# Patient Record
Sex: Male | Born: 1935 | Race: White | Hispanic: No | Marital: Married | Smoking: Current every day smoker
Health system: Southern US, Community
[De-identification: ages and names within clinical notes are randomized; demographics above are authoritative.]

## PROBLEM LIST (undated history)

## (undated) DIAGNOSIS — T8859XA Other complications of anesthesia, initial encounter: Secondary | ICD-10-CM

## (undated) DIAGNOSIS — I10 Essential (primary) hypertension: Secondary | ICD-10-CM

## (undated) DIAGNOSIS — N400 Enlarged prostate without lower urinary tract symptoms: Secondary | ICD-10-CM

## (undated) DIAGNOSIS — K635 Polyp of colon: Secondary | ICD-10-CM

## (undated) DIAGNOSIS — L309 Dermatitis, unspecified: Secondary | ICD-10-CM

## (undated) DIAGNOSIS — R112 Nausea with vomiting, unspecified: Secondary | ICD-10-CM

## (undated) DIAGNOSIS — F329 Major depressive disorder, single episode, unspecified: Secondary | ICD-10-CM

## (undated) DIAGNOSIS — F32A Depression, unspecified: Secondary | ICD-10-CM

## (undated) DIAGNOSIS — I639 Cerebral infarction, unspecified: Secondary | ICD-10-CM

## (undated) DIAGNOSIS — K4090 Unilateral inguinal hernia, without obstruction or gangrene, not specified as recurrent: Secondary | ICD-10-CM

## (undated) DIAGNOSIS — Z9889 Other specified postprocedural states: Secondary | ICD-10-CM

## (undated) DIAGNOSIS — E785 Hyperlipidemia, unspecified: Secondary | ICD-10-CM

## (undated) DIAGNOSIS — D649 Anemia, unspecified: Secondary | ICD-10-CM

## (undated) DIAGNOSIS — T4145XA Adverse effect of unspecified anesthetic, initial encounter: Secondary | ICD-10-CM

## (undated) DIAGNOSIS — C3490 Malignant neoplasm of unspecified part of unspecified bronchus or lung: Secondary | ICD-10-CM

## (undated) DIAGNOSIS — N210 Calculus in bladder: Secondary | ICD-10-CM

## (undated) DIAGNOSIS — K219 Gastro-esophageal reflux disease without esophagitis: Secondary | ICD-10-CM

## (undated) DIAGNOSIS — R4701 Aphasia: Secondary | ICD-10-CM

## (undated) HISTORY — DX: Depression, unspecified: F32.A

## (undated) HISTORY — DX: Aphasia: R47.01

## (undated) HISTORY — DX: Benign prostatic hyperplasia without lower urinary tract symptoms: N40.0

## (undated) HISTORY — DX: Polyp of colon: K63.5

## (undated) HISTORY — DX: Hyperlipidemia, unspecified: E78.5

## (undated) HISTORY — DX: Essential (primary) hypertension: I10

## (undated) HISTORY — DX: Dermatitis, unspecified: L30.9

## (undated) HISTORY — DX: Gastro-esophageal reflux disease without esophagitis: K21.9

## (undated) HISTORY — DX: Cerebral infarction, unspecified: I63.9

## (undated) HISTORY — DX: Unilateral inguinal hernia, without obstruction or gangrene, not specified as recurrent: K40.90

## (undated) HISTORY — DX: Calculus in bladder: N21.0

## (undated) HISTORY — DX: Major depressive disorder, single episode, unspecified: F32.9

---

## 1998-03-23 ENCOUNTER — Ambulatory Visit (HOSPITAL_COMMUNITY): Admission: RE | Admit: 1998-03-23 | Discharge: 1998-03-23 | Payer: Self-pay | Admitting: Gastroenterology

## 1998-07-09 ENCOUNTER — Emergency Department (HOSPITAL_COMMUNITY): Admission: EM | Admit: 1998-07-09 | Discharge: 1998-07-09 | Payer: Self-pay | Admitting: Emergency Medicine

## 1998-07-09 ENCOUNTER — Encounter: Payer: Self-pay | Admitting: Emergency Medicine

## 1998-09-18 ENCOUNTER — Encounter: Payer: Self-pay | Admitting: *Deleted

## 1998-09-18 ENCOUNTER — Inpatient Hospital Stay (HOSPITAL_COMMUNITY): Admission: EM | Admit: 1998-09-18 | Discharge: 1998-09-22 | Payer: Self-pay | Admitting: *Deleted

## 1998-09-20 ENCOUNTER — Encounter: Payer: Self-pay | Admitting: *Deleted

## 2000-01-19 ENCOUNTER — Encounter: Payer: Self-pay | Admitting: Emergency Medicine

## 2000-01-19 ENCOUNTER — Encounter: Payer: Self-pay | Admitting: *Deleted

## 2000-01-19 ENCOUNTER — Inpatient Hospital Stay (HOSPITAL_COMMUNITY): Admission: EM | Admit: 2000-01-19 | Discharge: 2000-01-24 | Payer: Self-pay | Admitting: Emergency Medicine

## 2000-01-21 ENCOUNTER — Encounter: Payer: Self-pay | Admitting: Neurology

## 2000-01-22 ENCOUNTER — Encounter: Payer: Self-pay | Admitting: Neurology

## 2000-01-31 ENCOUNTER — Encounter: Admission: RE | Admit: 2000-01-31 | Discharge: 2000-03-15 | Payer: Self-pay | Admitting: Neurology

## 2001-07-24 ENCOUNTER — Ambulatory Visit (HOSPITAL_COMMUNITY): Admission: RE | Admit: 2001-07-24 | Discharge: 2001-07-24 | Payer: Self-pay | Admitting: Gastroenterology

## 2004-09-16 ENCOUNTER — Ambulatory Visit (HOSPITAL_COMMUNITY): Admission: RE | Admit: 2004-09-16 | Discharge: 2004-09-16 | Payer: Self-pay | Admitting: Gastroenterology

## 2004-09-25 ENCOUNTER — Inpatient Hospital Stay (HOSPITAL_COMMUNITY): Admission: EM | Admit: 2004-09-25 | Discharge: 2004-09-29 | Payer: Self-pay | Admitting: Emergency Medicine

## 2005-11-09 ENCOUNTER — Ambulatory Visit (HOSPITAL_COMMUNITY): Admission: RE | Admit: 2005-11-09 | Discharge: 2005-11-09 | Payer: Self-pay | Admitting: Family Medicine

## 2010-07-23 ENCOUNTER — Inpatient Hospital Stay (HOSPITAL_COMMUNITY)
Admission: EM | Admit: 2010-07-23 | Discharge: 2010-07-26 | DRG: 469 | Disposition: A | Discharge status: Skilled Nursing Facility | Payer: Medicare Other | Attending: Internal Medicine | Admitting: Internal Medicine

## 2010-07-23 ENCOUNTER — Emergency Department (HOSPITAL_COMMUNITY): Payer: Medicare Other

## 2010-07-23 DIAGNOSIS — D649 Anemia, unspecified: Secondary | ICD-10-CM | POA: Diagnosis not present

## 2010-07-23 DIAGNOSIS — S72009A Fracture of unspecified part of neck of unspecified femur, initial encounter for closed fracture: Principal | ICD-10-CM | POA: Diagnosis present

## 2010-07-23 DIAGNOSIS — I214 Non-ST elevation (NSTEMI) myocardial infarction: Secondary | ICD-10-CM | POA: Diagnosis present

## 2010-07-23 DIAGNOSIS — F3289 Other specified depressive episodes: Secondary | ICD-10-CM | POA: Diagnosis present

## 2010-07-23 DIAGNOSIS — Y92009 Unspecified place in unspecified non-institutional (private) residence as the place of occurrence of the external cause: Secondary | ICD-10-CM

## 2010-07-23 DIAGNOSIS — F17200 Nicotine dependence, unspecified, uncomplicated: Secondary | ICD-10-CM | POA: Diagnosis present

## 2010-07-23 DIAGNOSIS — Z7982 Long term (current) use of aspirin: Secondary | ICD-10-CM

## 2010-07-23 DIAGNOSIS — I739 Peripheral vascular disease, unspecified: Secondary | ICD-10-CM | POA: Diagnosis present

## 2010-07-23 DIAGNOSIS — IMO0002 Reserved for concepts with insufficient information to code with codable children: Secondary | ICD-10-CM | POA: Diagnosis present

## 2010-07-23 DIAGNOSIS — F329 Major depressive disorder, single episode, unspecified: Secondary | ICD-10-CM | POA: Diagnosis present

## 2010-07-23 DIAGNOSIS — Z23 Encounter for immunization: Secondary | ICD-10-CM

## 2010-07-23 DIAGNOSIS — Z01818 Encounter for other preprocedural examination: Secondary | ICD-10-CM

## 2010-07-23 DIAGNOSIS — K5900 Constipation, unspecified: Secondary | ICD-10-CM | POA: Diagnosis not present

## 2010-07-23 DIAGNOSIS — Y939 Activity, unspecified: Secondary | ICD-10-CM

## 2010-07-23 DIAGNOSIS — I2510 Atherosclerotic heart disease of native coronary artery without angina pectoris: Secondary | ICD-10-CM | POA: Diagnosis present

## 2010-07-23 DIAGNOSIS — Z8673 Personal history of transient ischemic attack (TIA), and cerebral infarction without residual deficits: Secondary | ICD-10-CM

## 2010-07-23 DIAGNOSIS — I10 Essential (primary) hypertension: Secondary | ICD-10-CM | POA: Diagnosis present

## 2010-07-23 DIAGNOSIS — W010XXA Fall on same level from slipping, tripping and stumbling without subsequent striking against object, initial encounter: Secondary | ICD-10-CM | POA: Diagnosis present

## 2010-07-23 DIAGNOSIS — K219 Gastro-esophageal reflux disease without esophagitis: Secondary | ICD-10-CM | POA: Diagnosis present

## 2010-07-23 DIAGNOSIS — E079 Disorder of thyroid, unspecified: Secondary | ICD-10-CM | POA: Diagnosis present

## 2010-07-23 DIAGNOSIS — Z01810 Encounter for preprocedural cardiovascular examination: Secondary | ICD-10-CM

## 2010-07-23 DIAGNOSIS — Y998 Other external cause status: Secondary | ICD-10-CM

## 2010-07-23 DIAGNOSIS — E119 Type 2 diabetes mellitus without complications: Secondary | ICD-10-CM | POA: Diagnosis present

## 2010-07-24 ENCOUNTER — Emergency Department (HOSPITAL_COMMUNITY): Payer: Medicare Other

## 2010-07-24 LAB — CBC
HCT: 47.1 % (ref 39.0–52.0)
Hemoglobin: 15.3 g/dL (ref 13.0–17.0)
MCHC: 32.5 g/dL (ref 30.0–36.0)
MCV: 94.6 fL (ref 78.0–100.0)
Platelets: 234 10*3/uL (ref 150–400)
RBC: 4.98 MIL/uL (ref 4.22–5.81)
WBC: 19.6 10*3/uL — ABNORMAL HIGH (ref 4.0–10.5)

## 2010-07-24 LAB — URINALYSIS, ROUTINE W REFLEX MICROSCOPIC
Bilirubin Urine: NEGATIVE
Glucose, UA: 100 mg/dL — AB
Glucose, UA: NEGATIVE mg/dL
Ketones, ur: 15 mg/dL — AB
Ketones, ur: 15 mg/dL — AB
Leukocytes, UA: NEGATIVE
Nitrite: NEGATIVE
Nitrite: NEGATIVE
Protein, ur: 30 mg/dL — AB
Protein, ur: 100 mg/dL — AB
Specific Gravity, Urine: 1.02 (ref 1.005–1.030)
Specific Gravity, Urine: 1.028 (ref 1.005–1.030)
Urobilinogen, UA: 0.2 mg/dL (ref 0.0–1.0)
Urobilinogen, UA: 0.2 mg/dL (ref 0.0–1.0)
pH: 5.5 (ref 5.0–8.0)
pH: 5.5 (ref 5.0–8.0)

## 2010-07-24 LAB — GLUCOSE, CAPILLARY
Glucose-Capillary: 151 mg/dL — ABNORMAL HIGH (ref 70–99)
Glucose-Capillary: 131 mg/dL — ABNORMAL HIGH (ref 70–99)

## 2010-07-24 LAB — CARDIAC PANEL(CRET KIN+CKTOT+MB+TROPI)
CK, MB: 10.6 ng/mL (ref 0.3–4.0)
CK, MB: 8.8 ng/mL (ref 0.3–4.0)
Relative Index: 0.7 (ref 0.0–2.5)
Relative Index: 0.8 (ref 0.0–2.5)
Total CK: 1585 U/L — ABNORMAL HIGH (ref 7–232)
Total CK: 1052 U/L — ABNORMAL HIGH (ref 7–232)
Troponin I: 0.11 ng/mL — ABNORMAL HIGH (ref 0.00–0.06)
Troponin I: 0.1 ng/mL — ABNORMAL HIGH (ref 0.00–0.06)

## 2010-07-24 LAB — BASIC METABOLIC PANEL
Calcium: 9.5 mg/dL (ref 8.4–10.5)
Creatinine, Ser: 1.41 mg/dL (ref 0.4–1.5)
GFR calc Af Amer: 59 mL/min — ABNORMAL LOW (ref >60)
Potassium: 4.2 mEq/L (ref 3.5–5.1)

## 2010-07-24 LAB — URINE MICROSCOPIC-ADD ON

## 2010-07-24 LAB — DIFFERENTIAL
Basophils Relative: 0 % (ref 0–1)
Eosinophils Absolute: 0 10*3/uL (ref 0.0–0.7)
Eosinophils Relative: 0 % (ref 0–5)
Lymphocytes Relative: 4 % — ABNORMAL LOW (ref 12–46)
Lymphs Abs: 0.7 10*3/uL (ref 0.7–4.0)

## 2010-07-24 LAB — MRSA PCR SCREENING: MRSA by PCR: NEGATIVE

## 2010-07-24 LAB — PROTIME-INR: INR: 0.99 (ref 0.00–1.49)

## 2010-07-24 LAB — CK TOTAL AND CKMB (NOT AT ARMC)
CK, MB: 12.9 ng/mL (ref 0.3–4.0)
Total CK: 1862 U/L — ABNORMAL HIGH (ref 7–232)

## 2010-07-25 LAB — DIFFERENTIAL
Basophils Absolute: 0 10*3/uL (ref 0.0–0.1)
Basophils Relative: 0 % (ref 0–1)
Eosinophils Absolute: 0.4 10*3/uL (ref 0.0–0.7)
Eosinophils Relative: 3 % (ref 0–5)
Lymphocytes Relative: 6 % — ABNORMAL LOW (ref 12–46)
Lymphs Abs: 0.8 10*3/uL (ref 0.7–4.0)
Monocytes Absolute: 0.9 10*3/uL (ref 0.1–1.0)
Monocytes Relative: 7 % (ref 3–12)
Neutro Abs: 11.1 10*3/uL — ABNORMAL HIGH (ref 1.7–7.7)
Neutrophils Relative %: 84 % — ABNORMAL HIGH (ref 43–77)

## 2010-07-25 LAB — HEMOGLOBIN A1C
Hgb A1c MFr Bld: 5.9 % — ABNORMAL HIGH (ref <5.7)
Mean Plasma Glucose: 123 mg/dL — ABNORMAL HIGH (ref <117)

## 2010-07-25 LAB — GLUCOSE, CAPILLARY: Glucose-Capillary: 130 mg/dL — ABNORMAL HIGH (ref 70–99)

## 2010-07-25 LAB — COMPREHENSIVE METABOLIC PANEL
ALT: 24 U/L (ref 0–53)
AST: 41 U/L — ABNORMAL HIGH (ref 0–37)
Calcium: 9.4 mg/dL (ref 8.4–10.5)
GFR calc Af Amer: >60 mL/min (ref >60)
Potassium: 4.6 mEq/L (ref 3.5–5.1)
Sodium: 138 mEq/L (ref 135–145)
Total Protein: 6.1 g/dL (ref 6.0–8.3)

## 2010-07-25 LAB — COMPREHENSIVE METABOLIC PANEL WITH GFR
Albumin: 3.4 g/dL — ABNORMAL LOW (ref 3.5–5.2)
Alkaline Phosphatase: 65 U/L (ref 39–117)
BUN: 26 mg/dL — ABNORMAL HIGH (ref 6–23)
CO2: 25 meq/L (ref 19–32)
Chloride: 108 meq/L (ref 96–112)
Creatinine, Ser: 1.25 mg/dL (ref 0.4–1.5)
GFR calc non Af Amer: 56 mL/min — ABNORMAL LOW (ref >60)
Glucose, Bld: 140 mg/dL — ABNORMAL HIGH (ref 70–99)
Total Bilirubin: 0.8 mg/dL (ref 0.3–1.2)

## 2010-07-25 LAB — CBC
HCT: 43.3 % (ref 39.0–52.0)
Hemoglobin: 14 g/dL (ref 13.0–17.0)
MCH: 30.7 pg (ref 26.0–34.0)
MCHC: 32.3 g/dL (ref 30.0–36.0)
MCV: 95 fL (ref 78.0–100.0)
Platelets: 213 K/uL (ref 150–400)
RBC: 4.56 MIL/uL (ref 4.22–5.81)
RDW: 15.3 % (ref 11.5–15.5)
WBC: 13.2 K/uL — ABNORMAL HIGH (ref 4.0–10.5)

## 2010-07-25 LAB — PROTIME-INR
INR: 1.06 (ref 0.00–1.49)
Prothrombin Time: 14 seconds (ref 11.6–15.2)

## 2010-07-25 LAB — PHOSPHORUS: Phosphorus: 2.3 mg/dL (ref 2.3–4.6)

## 2010-07-25 LAB — APTT: aPTT: 34 s (ref 24–37)

## 2010-07-25 LAB — SURGICAL PCR SCREEN
MRSA, PCR: NEGATIVE
Staphylococcus aureus: NEGATIVE

## 2010-07-25 LAB — MAGNESIUM: Magnesium: 2 mg/dL (ref 1.5–2.5)

## 2010-07-25 LAB — TSH: TSH: 0.459 u[IU]/mL (ref 0.350–4.500)

## 2010-07-26 ENCOUNTER — Inpatient Hospital Stay (HOSPITAL_COMMUNITY): Payer: Medicare Other

## 2010-07-26 ENCOUNTER — Inpatient Hospital Stay (HOSPITAL_COMMUNITY)
Admission: AD | Admit: 2010-07-26 | Discharge: 2010-07-29 | Disposition: A | Discharge status: Skilled Nursing Facility | Payer: Medicare Other | Source: Other Acute Inpatient Hospital | Attending: Specialist | Admitting: Specialist

## 2010-07-26 DIAGNOSIS — Z7982 Long term (current) use of aspirin: Secondary | ICD-10-CM

## 2010-07-26 DIAGNOSIS — Y998 Other external cause status: Secondary | ICD-10-CM

## 2010-07-26 DIAGNOSIS — Z01810 Encounter for preprocedural cardiovascular examination: Secondary | ICD-10-CM

## 2010-07-26 DIAGNOSIS — Z01818 Encounter for other preprocedural examination: Secondary | ICD-10-CM

## 2010-07-26 DIAGNOSIS — F3289 Other specified depressive episodes: Secondary | ICD-10-CM | POA: Diagnosis present

## 2010-07-26 DIAGNOSIS — D649 Anemia, unspecified: Secondary | ICD-10-CM | POA: Diagnosis not present

## 2010-07-26 DIAGNOSIS — I10 Essential (primary) hypertension: Secondary | ICD-10-CM | POA: Diagnosis present

## 2010-07-26 DIAGNOSIS — W010XXA Fall on same level from slipping, tripping and stumbling without subsequent striking against object, initial encounter: Secondary | ICD-10-CM | POA: Diagnosis present

## 2010-07-26 DIAGNOSIS — E079 Disorder of thyroid, unspecified: Secondary | ICD-10-CM | POA: Diagnosis present

## 2010-07-26 DIAGNOSIS — K219 Gastro-esophageal reflux disease without esophagitis: Secondary | ICD-10-CM | POA: Diagnosis present

## 2010-07-26 DIAGNOSIS — K5900 Constipation, unspecified: Secondary | ICD-10-CM | POA: Diagnosis not present

## 2010-07-26 DIAGNOSIS — Z8673 Personal history of transient ischemic attack (TIA), and cerebral infarction without residual deficits: Secondary | ICD-10-CM

## 2010-07-26 DIAGNOSIS — F329 Major depressive disorder, single episode, unspecified: Secondary | ICD-10-CM | POA: Diagnosis present

## 2010-07-26 DIAGNOSIS — Y939 Activity, unspecified: Secondary | ICD-10-CM

## 2010-07-26 DIAGNOSIS — E119 Type 2 diabetes mellitus without complications: Secondary | ICD-10-CM | POA: Diagnosis present

## 2010-07-26 DIAGNOSIS — Z23 Encounter for immunization: Secondary | ICD-10-CM

## 2010-07-26 DIAGNOSIS — I739 Peripheral vascular disease, unspecified: Secondary | ICD-10-CM | POA: Diagnosis present

## 2010-07-26 DIAGNOSIS — IMO0002 Reserved for concepts with insufficient information to code with codable children: Secondary | ICD-10-CM | POA: Diagnosis present

## 2010-07-26 DIAGNOSIS — F17200 Nicotine dependence, unspecified, uncomplicated: Secondary | ICD-10-CM | POA: Diagnosis present

## 2010-07-26 DIAGNOSIS — Y92009 Unspecified place in unspecified non-institutional (private) residence as the place of occurrence of the external cause: Secondary | ICD-10-CM

## 2010-07-26 DIAGNOSIS — S72009A Fracture of unspecified part of neck of unspecified femur, initial encounter for closed fracture: Secondary | ICD-10-CM | POA: Diagnosis present

## 2010-07-26 DIAGNOSIS — I2510 Atherosclerotic heart disease of native coronary artery without angina pectoris: Secondary | ICD-10-CM | POA: Diagnosis present

## 2010-07-26 DIAGNOSIS — I214 Non-ST elevation (NSTEMI) myocardial infarction: Secondary | ICD-10-CM | POA: Diagnosis present

## 2010-07-26 LAB — GLUCOSE, CAPILLARY
Glucose-Capillary: 119 mg/dL — ABNORMAL HIGH (ref 70–99)
Glucose-Capillary: 134 mg/dL — ABNORMAL HIGH (ref 70–99)
Glucose-Capillary: 109 mg/dL — ABNORMAL HIGH (ref 70–99)
Glucose-Capillary: 108 mg/dL — ABNORMAL HIGH (ref 70–99)

## 2010-07-26 LAB — URINE CULTURE
Colony Count: NO GROWTH
Culture  Setup Time: 201203250220
Culture: NO GROWTH

## 2010-07-26 LAB — ABO/RH: ABO/RH(D): A NEG

## 2010-07-26 NOTE — Consult Note (Signed)
[name], PROVENCIO NO.:  [id number]  MEDICAL RECORD NO.:  [id number]           PATIENT TYPE:  I  LOCATION:  4733                         FACILITY:  [facility]  PHYSICIAN:  Jake Bathe, MD      DATE OF BIRTH:  Nov 20, 1932  DATE OF CONSULTATION:  07/24/2010 DATE OF DISCHARGE:                                CONSULTATION   REQUESTING PHYSICIAN:  Baltazar Najjar, MD, [facility].  REASON FOR CONSULTATION:  Evaluation of preoperative cardiovascular risk in the setting of left hip fracture with mildly elevated troponin.  HISTORY OF PRESENT ILLNESS:  [name] is a 77 year old male with known peripheral vascular disease status post right-sided carotid endarterectomy and 100% occlusion of left internal carotid artery with longstanding ongoing tobacco use, multiple strokes in the past, diabetes, who tripped over a stool, fell, could not get up, and was in significant amount of discomfort in his hip.  History provided by his family states that he was down for a few hours before being able to be assisted to the emergency room for further evaluation.  A left-sided hip fracture was discovered and Dr. Shelle Iron of Orthopedics was consulted.  Cardiac biomarkers were cycled and his CK was originally [phone] with an MB of 12.9 and a troponin of 0.10.  The second set was lower at 1585, MB of 10.6, and a troponin of 0.11.  He denies any prior cardiovascular history although he has a history of stroke and peripheral vascular disease as described above.  He denies any chest pain, angina, shortness of breath, orthopnea, PND, syncope, dizziness, or bleeding issues.  He has not had any exertional angina. Overall, he has not described any cardiovascular symptoms.  PAST MEDICAL HISTORY: 1. Multiple strokes. 2. Carotid artery disease status post endarterectomy. 3. Diabetes, not on insulin.  SOCIAL HISTORY:  Still a smoker, been for several years, not interested in quitting.  No  alcohol.  No drug abuse.  His daughter is a Engineer, civil (consulting).  FAMILY HISTORY:  Currently noncontributory.  ALLERGIES:  No known drug allergies.  HOME MEDICATIONS: 1. Ramipril 5 mg a day. 2. Nifedipine XR 60 mg at bedtime. 3. Paxil 20 mg a day. 4. Senna. 5. Docusate. 6. Ranitidine 150 mg twice a day. 7. Aspirin 81 mg a day. 8. Simvastatin 20 mg a day. 9. Metformin 500 mg a day, currently on hold.  REVIEW OF SYSTEMS:  Unless specified above, all other 12 review of systems negative.  PHYSICAL EXAMINATION:  VITAL SIGNS:  Temperature 97.7, pulse currently approximately 95 but ranged as high as 116, temperatures 97.7, blood pressure 160/80, currently 94% on 2 L. GENERAL:  He is alert, in no acute distress, able to answer questions confidently but may be mildly confused at times. EYES:  Well-perfused conjunctivae.  EOMI.  No scleral icterus. NECK:  Supple.  Difficult to appreciate carotid bruits.  Prior endarterectomy scar noted. HEART:  Regular rate and rhythm with a 2/6 systolic murmur, right upper sternal border.  Normal PMI. LUNGS:  Diffuse mild wheezes heard throughout lung fields.  Normal respiratory effort. GU:  Deferred. RECTAL:  Deferred. ABDOMEN:  Soft, nontender, and normoactive  bowel sounds.  No rebound or guarding. SKIN:  Multiple bruises and ecchymoses noted.  DATA:  Lab work as above.  Creatinine 1.4 and glucose 178.  EKG personally viewed shows sinus tachycardia, rate 103 with no ST-segment changes.  No Q-waves noted.  CT of head showed chronic ischemic changes and left thyroid hypodensity.  Thyroid ultrasound was recommended.  CT of spine showed no acute abnormality.  Chest x-ray showed no active cardiopulmonary disease.  ASSESSMENT AND PLAN:  This is a 78 year old male with right hip fracture, longstanding tobacco use, prior strokes, carotid endarterectomy of right carotid internal carotid artery, awaiting hip arthroplasty. 1. Preoperative risk assessment - he has  mildly elevated troponin of     0.11/0.10 in the setting of highly elevated CK and MB.  It is most     likely a type 2 non-ST-elevation myocardial infarction, not true     acute coronary syndrome in the traditional sense of thrombus     formation within the coronary arteries.  Nonetheless, a positive     troponin does portend a worse cardiovascular prognosis overall.  I do believe that his CK and MB are most likely elevated secondary to his fall or muscular injury.  They are trending downward.  I do not think that he warrants IV anticoagulation at this time.  I had a long discussion with his daughter who is a nurse about risks of surgery.  Of course, with his prior strokes, peripheral vascular disease, and now with type 2 non-ST-elevation myocardial infarction with a mildly positive troponin, he is at increased risk for surgery, however, I would advocate moving forward with surgery because of the increased mortality and not performing hip arthroplasty for him over the next 6-12 months.  Currently, he is not having any chest discomfort, no shortness of breath, no signs of overt heart failure, and no signs of any dangerous arrhythmias.  Once again, this mildly positive troponin is likely secondary to his underlying medical condition/musculoskeletal injury.  I will order a 2-D echocardiogram to assess his left ventricular function and I will also place him on low-dose metoprolol 12.5 mg twice a day.  Once again, I would proceed with surgery.  His family and he understand the increased cardiovascular risk given his multiple comorbidities and with his mildly elevated troponin. 1. Tobacco use - counseled on tobacco cessation.  He is not interested     in quitting. 2. Diabetes, per primary team currently monitoring. 3. Hypertension - currently well controlled.  Watch blood pressure. 4. Thyroid hypodensity - per primary team seen on CT scan. 5. Carotid artery disease - currently stable.   Aspirin.  I do not feel     that he needs Plavix at this point due to increased bleeding risk     surrounding arthroplasty.  Please call with any further questions.     Jake Bathe, MD     MCS/MEDQ  D:  07/24/2010  T:  07/25/2010  Job:  [id number]  cc:   Baltazar Najjar, MD Holley Bouche, M.D.  Electronically Signed by Donato Schultz MD on 07/26/2010 02:38:44 PM

## 2010-07-27 LAB — CBC
Hemoglobin: 10.9 g/dL — ABNORMAL LOW (ref 13.0–17.0)
MCH: 30 pg (ref 26.0–34.0)
MCHC: 32 g/dL (ref 30.0–36.0)
Platelets: 183 10*3/uL (ref 150–400)
RBC: 3.63 MIL/uL — ABNORMAL LOW (ref 4.22–5.81)

## 2010-07-27 LAB — BASIC METABOLIC PANEL
CO2: 23 mEq/L (ref 19–32)
Calcium: 8.3 mg/dL — ABNORMAL LOW (ref 8.4–10.5)
Chloride: 108 mEq/L (ref 96–112)
Creatinine, Ser: 1.12 mg/dL (ref 0.4–1.5)
GFR calc Af Amer: >60 mL/min (ref >60)
Sodium: 135 mEq/L (ref 135–145)

## 2010-07-27 LAB — GLUCOSE, CAPILLARY: Glucose-Capillary: 137 mg/dL — ABNORMAL HIGH (ref 70–99)

## 2010-07-27 NOTE — Consult Note (Signed)
[name], BRAZZEL            ACCOUNT NO.:  [id number]  MEDICAL RECORD NO.:  [id number]           PATIENT TYPE:  I  LOCATION:  1621                         FACILITY:  [facility]  PHYSICIAN:  Thad Ranger, MD       DATE OF BIRTH:  [birth year]  DATE OF CONSULTATION:  07/27/2010 DATE OF DISCHARGE:                                CONSULTATION   REQUESTING PHYSICIAN:  Jene Every, M.D.  REASON FOR CONSULTATION:  Medical management.  HISTORY OF PRESENT ILLNESS:  Mr. Rivero is a 77 year old male with history of diabetes, multiple strokes, tobacco abuse, depression, GERD, hypertension, currently under orthopedic service after left hemiarthroplasty on July 26, 2010.  The patient was initially admitted after a mechanical fall and had left hip pain.  He was found to have left femoral fracture.  He underwent left hip hemiarthroplasty on July 26, 2010, with spinal anesthesia.  Postoperatively, the patient has done well.  Medicine consultation is obtained for the medical management.  At this time of my encounter, the patient has no complaints at all.  He denies any chest pain or any shortness of breath, nausea, vomiting or abdominal pain.  He does have chronic constipation.  Otherwise doing well.  The patient had physical therapy evaluation today as well.  PAST MEDICAL HISTORY: 1. History of multiple strokes in the past with slight right-sided     fine motor deficit. 2. History of diabetes, not on insulin. 3. History of carotid artery disease, on aspirin. 4. History of right carotid endarterectomy. 5. Depression. 6. GERD. 7. Tobacco abuse.  ALLERGIES:  No known allergies.  FAMILY HISTORY:  Noncontributory.  SOCIAL HISTORY:  The patient smokes 2 packs per day for 55 years.  Does not drink or abuse any drugs.  Prior to this admission, he was living at home, independent with his ADLs.  PHYSICAL EXAMINATION:  VITAL SIGNS:  Blood pressure 119/70, temperature 97.4, pulse 107,  respirations 20, O2 sats 96% on 2 L. GENERAL:  The patient is alert and awake, however, poor historian. Family is at bedside. HEENT:  Anicteric sclerae, pink conjunctivae.  Pupils reactive to light and accommodation.  EOMI. NECK:  Supple.  No lymphadenopathy, no JVD. CVS:  S1, S2 clear.  Tachycardia, however, no murmurs appreciated. LUNGS:  Fairly clear to auscultation bilaterally. ABDOMEN:  Soft, nontender, nondistended. EXTREMITIES:  No cyanosis, clubbing or edema noted.  Bilateral lower SCDs are noted.  DIAGNOSTIC DATA:  BMET today, July 27, 2010; sodium 135, potassium 3.7, BUN 27, creatinine 1.1, calcium 8.3.  CBC; WBC 8.9, hemoglobin 10.9, hematocrit 34.1, platelets 183.  ASSESSMENT AND PLAN:  Mr. Fontenot is a 77 year old male with left hip fracture status post left hip hemiarthroplasty with multiple other medical problems, fairly stable. 1. Left hip fracture status post left hip hemiarthroplasty, post day     #1.  We will defer the management to the primary service     orthopedics. 2. History of diabetes.  Continue sliding scale insulin for now. 3. Hypertension.  Currently BP is well controlled, however, the     patient noted to be on multiple antihypertensives as an outpatient,  on ramipril, clonidine patch and nifedipine.  I will for now     continue only ramipril and hold off on clonidine and nifedipine to     avoid hypotension. 4. History of depression.  Continue paroxetine. 5. History of multiple cerebrovascular accidents in the past with     history of carotid artery disease.  Restart aspirin. 6. Gastroesophageal reflux disease.  Continue ranitidine.  Currently     stable. 7. Thyroid hypodensity 6 mm on the CT scan.  Obtain TSH, free T4 and     T3 levels.  Thyroid ultrasound can be done inpatient versus     outpatient.  8. Tobacco abuse.  We will place the patient on nicotine patch.     Currently he is not wheezing or having any respiratory distress.     We  will also place him on albuterol nebs for any shortness of     breath or wheezing. 9. Constipation.  Place him on Senokot and MiraLax. 10.DVT prophylaxis.  Noted the patient to be on bilateral SCDs as well     as Lovenox.  This patient will be followed from [facility].  Thank you for the consult.  It was pleasure taking care of Mr. Aziah Brostrom.     Thad Ranger, MD     RR/MEDQ  D:  07/27/2010  T:  07/27/2010  Job:  [id number]  cc:   Jene Every, M.D. Fax: [phone]  Melida Quitter, M.D. Fax: [phone]  Electronically Signed by Hodaya Curto  on 07/27/2010 03:52:35 PM

## 2010-07-28 ENCOUNTER — Inpatient Hospital Stay (HOSPITAL_COMMUNITY): Payer: Medicare Other

## 2010-07-28 LAB — GLUCOSE, CAPILLARY: Glucose-Capillary: 137 mg/dL — ABNORMAL HIGH (ref 70–99)

## 2010-07-28 LAB — CBC
HCT: 32.5 % — ABNORMAL LOW (ref 39.0–52.0)
Hemoglobin: 10.2 g/dL — ABNORMAL LOW (ref 13.0–17.0)
MCH: 29.6 pg (ref 26.0–34.0)
MCHC: 31.4 g/dL (ref 30.0–36.0)
RDW: 14.8 % (ref 11.5–15.5)

## 2010-07-28 LAB — BASIC METABOLIC PANEL
BUN: 27 mg/dL — ABNORMAL HIGH (ref 6–23)
CO2: 26 mEq/L (ref 19–32)
Calcium: 8.3 mg/dL — ABNORMAL LOW (ref 8.4–10.5)
Creatinine, Ser: 1.13 mg/dL (ref 0.4–1.5)
GFR calc non Af Amer: >60 mL/min (ref >60)
Glucose, Bld: 134 mg/dL — ABNORMAL HIGH (ref 70–99)

## 2010-07-28 LAB — TSH: TSH: 0.533 u[IU]/mL (ref 0.350–4.500)

## 2010-07-28 LAB — T3, FREE: T3, Free: 2 pg/mL — ABNORMAL LOW (ref 2.3–4.2)

## 2010-07-28 LAB — T4, FREE: Free T4: 0.95 ng/dL (ref 0.80–1.80)

## 2010-07-29 LAB — CROSSMATCH

## 2010-07-29 LAB — BASIC METABOLIC PANEL
Chloride: 108 mEq/L (ref 96–112)
GFR calc non Af Amer: >60 mL/min (ref >60)
Glucose, Bld: 118 mg/dL — ABNORMAL HIGH (ref 70–99)
Potassium: 3.9 mEq/L (ref 3.5–5.1)
Sodium: 139 mEq/L (ref 135–145)

## 2010-07-29 LAB — CBC
HCT: 34.6 % — ABNORMAL LOW (ref 39.0–52.0)
RBC: 3.67 MIL/uL — ABNORMAL LOW (ref 4.22–5.81)
RDW: 14.7 % (ref 11.5–15.5)
WBC: 8 10*3/uL (ref 4.0–10.5)

## 2010-07-29 LAB — GLUCOSE, CAPILLARY: Glucose-Capillary: 131 mg/dL — ABNORMAL HIGH (ref 70–99)

## 2010-07-29 NOTE — H&P (Signed)
[name], [name]            ACCOUNT NO.:  [id number]  MEDICAL RECORD NO.:  [id number]           PATIENT TYPE:  E  LOCATION:  [facility]                         FACILITY:  [facility]  PHYSICIAN:  Michiel Cowboy, MDDATE OF BIRTH:  1932-06-06  DATE OF ADMISSION:  07/24/2010 DATE OF DISCHARGE:                             HISTORY & PHYSICAL   ATTENDING PHYSICIAN:  Michiel Cowboy, MD  PRIMARY CARE PROVIDER:  Unknown.  The patient is not sure.  CHIEF COMPLAINT:  Hip pain.  HISTORY OF PRESENT ILLNESS:  The patient is a 77 year old gentleman.  He lives alone by himself.  He has history of diabetes and stroke.  He states he has tripped over the stool and fell.  He could not get up, he did not endorse hitting his head to me.  At this point, his only pain complaint is in the left hip.  He denied any history of chest pain, shortness of breath, but I wonder if his memory is intact.  We attempted to get one of his family but they were not picking up the phone.  He is a poor historian and cannot provide any much more history other than that.  REVIEW OF SYSTEMS:  Otherwise unable to obtain except for as above.  PAST MEDICAL HISTORY:  Significant for; 1. Multiple strokes. 2. Diabetes, not on insulin. 3. History of carotid artery disease. 4. Status post right carotid endarterectomy.  SOCIAL HISTORY:  The patient continues to smoke.  Do not drink or abuse drugs.  FAMILY HISTORY:  Noncontributory.  ALLERGIES:  None.  MEDICATIONS:  He cannot recollect what medicines he takes, he thinks he might be on the blood thinner.  In 2006, he was on Plavix for his stroke.  I do not have any list of medications for him.  He cannot recollect at all what he takes.  His family is unreachable at this point.  PHYSICAL EXAMINATION:  VITAL SIGNS:  Temperature 98.9, blood pressure 110/65, pulse 109, respirations 18, satting 94% on 2 L and 86% on room air. GENERAL:  The patient appears to be in no  acute distress. HEENT:  Head is nontraumatic.  Somewhat dryish mucous membranes. LUNGS:  Occasional wheezes. HEART:  Rapid but regular.  No murmurs appreciated. ABDOMEN:  Soft, nontender, nondistended. NEUROLOGIC:  Strength on upper extremity is intact.  He is moving bilateral lower extremity but left lower extremity is shortened and rotated secondary to fracture. CRANIAL NERVES:  Appears to be intact.  LABORATORY DATA:  White blood cell count 19.6, hemoglobin 15.3, sodium 137, potassium 4.2, creatinine 1.4 which is up from baseline of 1.2, repeated creatinine since then.  Chest x-ray is unremarkable.  Left hip showing acute left femoral fracture.  CT scan of the head and neck is done and pending.  EKG is unchanged from prior sinus tachycardia, no ischemic changes.  ASSESSMENT AND PLAN:  This is a 77 year old gentleman with left hip fracture, history of diabetes, strokes, and possible dementia. 1. Left hip fracture.  We will admit to Dr. Shelle Iron, the orthopedics to     consult for treatment.  For preoperative workup, we will cycle  cardiac enzymes as he is a very unreliable historian and Anesthesia     should be aware of his history of coronary artery disease.  We will     hold off on beta blockers as his blood pressure is somewhat soft.     At this point, his blood pressure allows would give preoperative     beta blockers if possible because he is already somewhat     tachycardic.  I am not sure if he still on Plavix or not, would     wait until morning to get hold his family.  If he has been on     Plavix, he might need to have some time before his procedure could     be done. 2. History of diabetes.  We will put on sliding scale for now, hold     metformin if he has been taking it. 3. Elevated white blood cell count.  Chest x-ray is unremarkable.  We     will check urine, this could be stress demargination. 4. History of tobacco abuse.  We will give Xopenex as needed and      schedule Atrovent. 5. Trouble with memory I wonder if the patient has some degree of     dementia we will need to get hold of family to confirm this.  We     will write for social work consult to assess his safety at home.     Once the patient able to ambulate,  we will write for PT/OT consult     as well. 6. Prophylaxis, Protonix and SCDs.     Michiel Cowboy, MD     AVD/MEDQ  D:  07/24/2010  T:  07/24/2010  Job:  [id number]  cc:   Jene Every, M.D.  Electronically Signed by Therisa Doyne MD on 07/29/2010 02:39:48 AM

## 2010-07-29 NOTE — Consult Note (Signed)
  NAMEGERMANY, CHELF NO.:  [id number]  MEDICAL RECORD NO.:  [id number]           PATIENT TYPE:  LOCATION:                                 FACILITY:  PHYSICIAN:  Jene Every, M.D.    DATE OF BIRTH:  1933-04-09  DATE OF CONSULTATION:  07/24/2010 DATE OF DISCHARGE:                                CONSULTATION   CHIEF COMPLAINT:  Left hip pain.  HISTORY:  This is a pleasant 77 year old male who fell onto his left hip.  He was seen in the emergency room, diagnosed with a femoral neck fracture.  He will be admitted by the Medicine Service, was consulted for a femoral neck fracture.  The patient had no loss of consciousness.  Apparently, he had a CT scan of his head and neck which were negative.  REVIEW OF SYSTEMS:  Negative for chest pain, shortness of breath.  PAST MEDICAL HISTORY:  Diabetes, stroke.  Current medications are unknown.  He is on a beta-blocker, BP medications, possible Plavix.  Tobacco positive.  Alcohol negative.  SOCIAL HISTORY:  He lives at home.  PHYSICAL EXAMINATION:  VITAL SIGNS:  The patient afebrile, temperature 98.9, pulse 101, respiration 24. HEENT:  Within normal limits. CORONARY:  Regular rate rhythm. PULMONARY:  Clear to auscultation. ABDOMEN:  Soft, nontender. EXTREMITIES:  Left lower extremity externally rotated and slightly short.  He has good dorsiflexion and plantar flexion.  Dorsalis pedis 2+ and posterior tibial pulse.  Pain with attempted range of motion of his left hip.  Compartments are soft.  Nontender lumbar spine.  X-rays of the chest are negative.  X-rays of the hip demonstrated displaced femoral neck fracture.  CT scan of head and neck is negative.  LABORATORY DATA:  White count is 19.6.  Creatinine is 1.4, INR 0.99.  IMPRESSION: 1. Acute displaced left femoral neck fracture. 2. Comorbidities including stroke, noninsulin-dependant diabetes,     possibly on Plavix. 3. Leukocytosis, urinalysis  pending. 4. Tobacco use.  PLAN:  Discussed options.  We need to pursue with left hip hemiarthroplasty following medical clearance and pending further workup. Risks and benefits discussed including bleeding, infection, dislocation, need for revision, DVT, PE, anesthetic complications, etc.  He will admitted on the Medical Service to undergo IV hydration.  Then, we will proceed accordingly, DVT prophylaxis, etc.     Jene Every, M.D.     JB/MEDQ  D:  07/24/2010  T:  07/24/2010  Job:  [id number]  Electronically Signed by Jene Every M.D. on 07/29/2010 81:19:14 AM

## 2010-07-29 NOTE — Op Note (Addendum)
[name], [name] NO.:  [id number]  MEDICAL RECORD NO.:  [id number]           PATIENT TYPE:  I  LOCATION:  1621                         FACILITY:  [facility]  PHYSICIAN:  [name], M.D.    DATE OF BIRTH:  1932-07-21  DATE OF PROCEDURE:  07/26/2010 DATE OF DISCHARGE:                              OPERATIVE REPORT   PREOPERATIVE DIAGNOSIS:  Left femoral neck fracture.  POSTOPERATIVE DIAGNOSIS:  Left femoral neck fracture.  PROCEDURE PERFORMED:  Left hip hemiarthroplasty.  ANESTHESIA:  Spinal.  ASSISTANT:  OR tech.  COMPONENTS:  DePuy Prodigy 16.5 stem, 55 bipolar head.  BLOOD LOSS:  600 cc.  BRIEF HISTORY:  This is a 77 year old who sustained a femoral neck fracture, indicated for replacement.  Risks and benefits discussed including bleeding, infection, damage to neurovascular structures, suboptimal range of motion, DVT, PE, anesthetic complication, dislocation.  TECHNIQUE:  With the patient in supine position after induction of adequate anesthesia, 1 g Kefzol and 600 clindamycin, the patient had multiple skin lesions from his fall.  In addition, the patient had following his spinal, had evacuated his bowel contents, it was felt that additional clindamycin was then warranted.  In the right lateral decubitus position, all bony prominences well padded, hip holder was utilized.  Foley to gravity, well leg padded.  Left peritrochanteric and hip and thigh were prepped and draped in usual sterile fashion.  Skin incision based upon the trochanter was made through the skin. Subcutaneous tissue was dissected.  Electrocautery was utilized to achieve hemostasis.  Fascia lata identified, divided along the skin incision.  Self-retaining retractor was placed.  Adductor tenotomy performed.  Hip internally rotated.  External rotators were identified. Most were torn, identified, tagged, and reflected posteriorly to protect sciatic nerve throughout the case.   Hematoma was evacuated.  Fracture was noted.  Osteotomy performed 1 fingerbreadth above the lesser trochanter, head removed.  This was sized to 55.  We then used the initiator, found the canal, sequentially reamed to 16 mm in diameter with good cortical purchase, sequentially broached.  We felt the 16.5 large was better than the small.  This was then selected.  After copious irrigation of the canal and palpating inside of the canal, there was no breach of the cortex.  We selected the 16.5 Prodigy.  Head was removed, measured at 55.  Acetabulum inspected, no severe osteoarthrosis, was debrided and irrigated.  Electrocautery was utilized to achieve strict hemostasis.  I then impacted the Prodigy in appropriate version with excellent fit and purchase.  Following this, we cleaned the trunnion, put a bipolar assembly 1.5 head, 55 bipolar assembly impacted onto the trunnion, reduced the hip, and it was stable throughout a full range of motion.  Wound copiously irrigated.  Electrocautery was utilized to achieve strict hemostasis.  Copiously irrigated again throughout. Repaired the adductor tenotomy with 1 Vicryl interrupted figure-of-8 sutures.  The part of the capsule, which was intact after the capsulotomy was performed, was repaired with #1 Vicryl interrupted figure-of-8 sutures.  Fascia lata repaired with #1 Vicryl interrupted figure-of-8 sutures.  There was no significant hematoma at the closure time.  Irrigated the  subcutaneous tissue and they were closed with 0 and 2-0 Vicryl simple sutures.  Skin was reapproximated with staples.  Wound was dressed sterilely, secured with Elastoplast dressing.  Placed supine on the hospital bed.  Leg lengths equivalent.  Pulses intact.  Knee immobilizer placed and transported to the recovery room in satisfactory condition.  The patient tolerated procedure well.  No complications.  Blood loss 600 cc.     [name], M.D.     [name]  D:   07/26/2010  T:  07/27/2010  Job:  [id number]  Electronically Signed by [name] M.D. on 08/18/2010 12:04:30 PM

## 2010-08-01 HISTORY — PX: HIP ARTHROPLASTY: SHX981

## 2010-09-06 NOTE — Discharge Summary (Signed)
NAMEMARLAN, STEWARD            ACCOUNT NO.:  [id number]  MEDICAL RECORD NO.:  [id number]           PATIENT TYPE:  I  LOCATION:  1621                         FACILITY:  [facility]  PHYSICIAN:  Jene Every, M.D.    DATE OF BIRTH:  09/04/1932  DATE OF ADMISSION:  07/24/2010 DATE OF DISCHARGE:  07/29/2010                              DISCHARGE SUMMARY   ADMISSION DIAGNOSES: 1. Status post fall with the left femoral neck fracture. 2. History of multiple cerebrovascular accidents. 3. Diabetes. 4. Coronary artery disease. 5. Status post right endocardectomy.  DISCHARGE DIAGNOSES: 1. Status post fall with  the left femoral neck fracture. 2. History of multiple cerebrovascular accidents. 3. Diabetes. 4. Coronary artery disease. 5. Status post right endocardectomy. 6. Status post left hip hemiarthroplasty. 7. Asymptomatic postoperative anemia. Otherwise same as above.  HISTORY:  [name] is a 77 year old gentleman with multiple medical history. He unfortunately sustained a fall and noted immediate onset of left hip pain.  He was initially admitted over [facility] by [facility] hospitalist. They obtained medical as well as cardiac clearance.  When the patient was cleared, he was transferred over to [facility] to undergo left hip hemiarthroplasty.  Dr. Shelle Iron evaluated the patient at [facility] as well as in the ER. Risks, benefits of the surgery were discussed with the patient and family, they do elect to proceed.  The patient was again transferred to [facility] secondary to unavailability of the OR at the [facility]. Dr. Shelle Iron did assume care of the patient once at [facility]. Medicine did consult along with [place].  CONSULTS:  PT/OT Care Management.  CARDIOLOGY:  [facility].  PROCEDURE:  The patient was taken to the OR, underwent left hip hemiarthroplasty.  SURGEON:  Jene Every, M.D.  ASSISTANT:  Roma Schanz PA-C.  ANESTHESIA:   General.  COMPLICATIONS:  None.  LABORATORY DATA:  Preadmission labs shows white cell count 19.6, hemoglobin 15.3, hematocrit 47.1.  This was monitored throughout the hospital course.  White cell count did normalize at time of discharge, 8.0; hemoglobin remained fairly stable, postoperative hemoglobin 10.9; hematocrit 34.6.  Routine coagulation studies done preop showed PT 13.3, INR of 0.99.  The patient was placed on Lovenox for DVT prophylaxis. There no daily PT/INR was obtained. Glucose is monitored throughout the hospital stay, remained fairly stable. At time of discharge, capillary glucose is 131.  Preop chemistries showed sodium 137, potassium 4.2 with a normal BUN and creatinine.  He did have decreased GFR. Throughout the hospital stay, chemistries were continued to be evaluated, sodium and potassium remained stable.  The patient did have elevated BUN of 27; on the of 28, this normalized to 21 with a creatinine of 0.91. Preop urinalysis showed moderate blood, 100 mg/dL of glucose, 30 mg/dL of protein and green-yellow casts were noted, which is present. Repeat urinalysis obtained which continued to show blood in the urine.  Preop MRSA screen of staph screen was negative.  IMAGING:  Preoperative chest x-ray showed no active cardiopulmonary disease.  The patient did have multiple other radiologic studies, CT of the spine, CT of the head which showed  chronic changes, no acute changes were noted of the as C-spine or of the head.  Postoperative films of the hip showed stable position with hemiarthroplasty without complication.  Preoperative EKG showed sinus tachycardia, but was cleared by cardiology to undergo surgery.  HOSPITAL COURSE:  The patient was initially seen in [facility] Emergency Room and evaluated by Dr. Shelle Iron, admitted by the medical staff. Cardiac as well as medical clearance was obtained. Once clearance was obtained, the patient was transferred over the [facility] secondary to no OR being available at [facility]. He was taken to the OR and underwent the above-stated procedure without difficulty.  He was then transferred to Dr. Shelle Iron service, transferred to the orthopedic floor. Postoperatively, the patient did fairly well.  He did note pain in the hip as expected.  Vital signs remained stable.  He was afebrile. Hemoglobin was stable at 10.9, hematocrit 31.4. He had good urine output.  We did reconsult medicine to follow along with [place] while he was at [facility]. The patient did progress slowly with physical therapy. Discharge planning was initiated for placement in the SNF. Over the course of next few days, the patient continued to stabilize. Hemoglobin remained stable.  Vital signs remained stable as well.  Postop day #3, as of this time, the patient was stable to be discharged to nursing home of choice.  Vital signs stable, afebrile.  Hemoglobin 10.99, hematocrit 34.6.  Incision was clean, dry, intact.  No evidence of hematoma.  Motor neurovascular function intact to the lower extremity.  The patient was on Lovenox for DVT prophylaxis.  DISPOSITION:  Patient stable to be discharged to [facility].  FOLLOWUP:  He is to follow with Dr. Shelle Iron in approximately 14 days for staple removal and x-ray.  WOUND CARE:  Change his dressing daily and keep this area clean and dry.  ACTIVITIES:  Be partial weightbearing 25-50% on the left lower extremity.  Continue with elevation.  DISCHARGE MEDICATIONS:  Discharge medications as per med rec sheet, including Lovenox as per in the hospital for DVT prophylaxis and to be on this for a total of 14 days.  DIET:  Carb-modified.  CONDITION ON DISCHARGE:  Stable.  FINAL DIAGNOSIS:  Status post left hip hemiarthroplasty.     Roma Schanz, P.A.   ______________________________ Jene Every, M.D.    CS/MEDQ  D:  07/29/2010  T:  07/29/2010  Job:  [id number]  Electronically Signed by Roma Schanz P.A. on 08/30/2010 12:02:59 PM Electronically Signed by Jene Every M.D. on 09/06/2010 02:23:20 PM

## 2010-09-10 ENCOUNTER — Emergency Department (INDEPENDENT_AMBULATORY_CARE_PROVIDER_SITE_OTHER): Payer: Medicare Other

## 2010-09-10 ENCOUNTER — Emergency Department (HOSPITAL_BASED_OUTPATIENT_CLINIC_OR_DEPARTMENT_OTHER)
Admission: EM | Admit: 2010-09-10 | Discharge: 2010-09-11 | Disposition: A | Discharge status: Other Acute Inpatient Hospital | Payer: Medicare Other | Source: Home / Self Care | Attending: Emergency Medicine | Admitting: Emergency Medicine

## 2010-09-10 DIAGNOSIS — M7989 Other specified soft tissue disorders: Secondary | ICD-10-CM

## 2010-09-10 DIAGNOSIS — R5381 Other malaise: Secondary | ICD-10-CM | POA: Insufficient documentation

## 2010-09-10 DIAGNOSIS — E875 Hyperkalemia: Secondary | ICD-10-CM | POA: Insufficient documentation

## 2010-09-10 DIAGNOSIS — R4182 Altered mental status, unspecified: Secondary | ICD-10-CM

## 2010-09-10 DIAGNOSIS — N19 Unspecified kidney failure: Secondary | ICD-10-CM | POA: Insufficient documentation

## 2010-09-10 DIAGNOSIS — J189 Pneumonia, unspecified organism: Secondary | ICD-10-CM | POA: Insufficient documentation

## 2010-09-10 DIAGNOSIS — E1169 Type 2 diabetes mellitus with other specified complication: Secondary | ICD-10-CM | POA: Insufficient documentation

## 2010-09-10 DIAGNOSIS — Z8679 Personal history of other diseases of the circulatory system: Secondary | ICD-10-CM | POA: Insufficient documentation

## 2010-09-10 DIAGNOSIS — R5383 Other fatigue: Secondary | ICD-10-CM | POA: Insufficient documentation

## 2010-09-10 DIAGNOSIS — F0390 Unspecified dementia without behavioral disturbance: Secondary | ICD-10-CM

## 2010-09-10 DIAGNOSIS — Z79899 Other long term (current) drug therapy: Secondary | ICD-10-CM | POA: Insufficient documentation

## 2010-09-10 DIAGNOSIS — R627 Adult failure to thrive: Secondary | ICD-10-CM | POA: Insufficient documentation

## 2010-09-10 DIAGNOSIS — N390 Urinary tract infection, site not specified: Secondary | ICD-10-CM | POA: Insufficient documentation

## 2010-09-10 LAB — CK TOTAL AND CKMB (NOT AT ARMC)
CK, MB: 4.4 ng/mL — ABNORMAL HIGH (ref 0.3–4.0)
Relative Index: INVALID (ref 0.0–2.5)
Total CK: 72 U/L (ref 7–232)

## 2010-09-10 LAB — COMPREHENSIVE METABOLIC PANEL
AST: 59 U/L — ABNORMAL HIGH (ref 0–37)
Albumin: 2.8 g/dL — ABNORMAL LOW (ref 3.5–5.2)
BUN: 99 mg/dL — ABNORMAL HIGH (ref 6–23)
Calcium: 9.4 mg/dL (ref 8.4–10.5)
Chloride: 101 mEq/L (ref 96–112)
Creatinine, Ser: 8.5 mg/dL — ABNORMAL HIGH (ref 0.4–1.5)
GFR calc Af Amer: 7 mL/min — ABNORMAL LOW (ref >60)
GFR calc non Af Amer: 6 mL/min — ABNORMAL LOW (ref >60)
Total Bilirubin: 0.2 mg/dL — ABNORMAL LOW (ref 0.3–1.2)

## 2010-09-10 LAB — DIFFERENTIAL
Basophils Absolute: 0 10*3/uL (ref 0.0–0.1)
Basophils Relative: 0 % (ref 0–1)
Eosinophils Absolute: 0 10*3/uL (ref 0.0–0.7)
Eosinophils Relative: 0 % (ref 0–5)
Lymphocytes Relative: 4 % — ABNORMAL LOW (ref 12–46)
Lymphs Abs: 0.6 K/uL — ABNORMAL LOW (ref 0.7–4.0)
Monocytes Absolute: 0.9 10*3/uL (ref 0.1–1.0)
Monocytes Relative: 6 % (ref 3–12)
Neutro Abs: 14.1 10*3/uL — ABNORMAL HIGH (ref 1.7–7.7)
Neutrophils Relative %: 91 % — ABNORMAL HIGH (ref 43–77)

## 2010-09-10 LAB — PROTIME-INR
INR: 1.12 (ref 0.00–1.49)
Prothrombin Time: 14.6 seconds (ref 11.6–15.2)

## 2010-09-10 LAB — URINALYSIS, ROUTINE W REFLEX MICROSCOPIC
Bilirubin Urine: NEGATIVE
Glucose, UA: NEGATIVE mg/dL
Hgb urine dipstick: NEGATIVE
Ketones, ur: NEGATIVE mg/dL
Nitrite: NEGATIVE
Protein, ur: NEGATIVE mg/dL
Specific Gravity, Urine: 1.014 (ref 1.005–1.030)
Urobilinogen, UA: 0.2 mg/dL (ref 0.0–1.0)
pH: 5 (ref 5.0–8.0)

## 2010-09-10 LAB — COMPREHENSIVE METABOLIC PANEL WITH GFR
ALT: 70 U/L — ABNORMAL HIGH (ref 0–53)
Alkaline Phosphatase: 121 U/L — ABNORMAL HIGH (ref 39–117)
CO2: 14 meq/L — ABNORMAL LOW (ref 19–32)
Glucose, Bld: 70 mg/dL (ref 70–99)
Potassium: 6 meq/L — ABNORMAL HIGH (ref 3.5–5.1)
Sodium: 135 meq/L (ref 135–145)
Total Protein: 6.9 g/dL (ref 6.0–8.3)

## 2010-09-10 LAB — GLUCOSE, CAPILLARY
Glucose-Capillary: 54 mg/dL — ABNORMAL LOW (ref 70–99)
Glucose-Capillary: 105 mg/dL — ABNORMAL HIGH (ref 70–99)

## 2010-09-10 LAB — URINE MICROSCOPIC-ADD ON

## 2010-09-10 LAB — APTT: aPTT: 32 s (ref 24–37)

## 2010-09-10 LAB — CBC
HCT: 31.5 % — ABNORMAL LOW (ref 39.0–52.0)
Hemoglobin: 10.1 g/dL — ABNORMAL LOW (ref 13.0–17.0)
MCH: 29.4 pg (ref 26.0–34.0)
MCHC: 32.1 g/dL (ref 30.0–36.0)
MCV: 91.8 fL (ref 78.0–100.0)
Platelets: 511 10*3/uL — ABNORMAL HIGH (ref 150–400)
RBC: 3.43 MIL/uL — ABNORMAL LOW (ref 4.22–5.81)
RDW: 15.8 % — ABNORMAL HIGH (ref 11.5–15.5)
WBC: 15.6 K/uL — ABNORMAL HIGH (ref 4.0–10.5)

## 2010-09-10 LAB — TROPONIN I: Troponin I: <0.3 ng/mL (ref <0.30)

## 2010-09-11 ENCOUNTER — Inpatient Hospital Stay (HOSPITAL_COMMUNITY)
Admission: EM | Admit: 2010-09-11 | Discharge: 2010-09-13 | DRG: 682 | Disposition: A | Discharge status: Skilled Nursing Facility | Payer: Medicare Other | Source: Other Acute Inpatient Hospital | Attending: Internal Medicine | Admitting: Internal Medicine

## 2010-09-11 ENCOUNTER — Inpatient Hospital Stay (HOSPITAL_COMMUNITY): Payer: Medicare Other

## 2010-09-11 DIAGNOSIS — G9341 Metabolic encephalopathy: Secondary | ICD-10-CM | POA: Diagnosis present

## 2010-09-11 DIAGNOSIS — D649 Anemia, unspecified: Secondary | ICD-10-CM | POA: Diagnosis present

## 2010-09-11 DIAGNOSIS — N210 Calculus in bladder: Secondary | ICD-10-CM | POA: Diagnosis present

## 2010-09-11 DIAGNOSIS — Z7901 Long term (current) use of anticoagulants: Secondary | ICD-10-CM

## 2010-09-11 DIAGNOSIS — N179 Acute kidney failure, unspecified: Principal | ICD-10-CM | POA: Diagnosis present

## 2010-09-11 DIAGNOSIS — J189 Pneumonia, unspecified organism: Secondary | ICD-10-CM | POA: Diagnosis present

## 2010-09-11 DIAGNOSIS — Z6825 Body mass index (BMI) 25.0-25.9, adult: Secondary | ICD-10-CM

## 2010-09-11 DIAGNOSIS — I10 Essential (primary) hypertension: Secondary | ICD-10-CM | POA: Diagnosis present

## 2010-09-11 DIAGNOSIS — N1330 Unspecified hydronephrosis: Secondary | ICD-10-CM | POA: Diagnosis present

## 2010-09-11 DIAGNOSIS — J9819 Other pulmonary collapse: Secondary | ICD-10-CM | POA: Diagnosis present

## 2010-09-11 DIAGNOSIS — I2510 Atherosclerotic heart disease of native coronary artery without angina pectoris: Secondary | ICD-10-CM | POA: Diagnosis present

## 2010-09-11 DIAGNOSIS — N390 Urinary tract infection, site not specified: Secondary | ICD-10-CM | POA: Diagnosis present

## 2010-09-11 DIAGNOSIS — E1169 Type 2 diabetes mellitus with other specified complication: Secondary | ICD-10-CM | POA: Diagnosis present

## 2010-09-11 DIAGNOSIS — E875 Hyperkalemia: Secondary | ICD-10-CM | POA: Diagnosis present

## 2010-09-11 DIAGNOSIS — J690 Pneumonitis due to inhalation of food and vomit: Secondary | ICD-10-CM | POA: Diagnosis present

## 2010-09-11 DIAGNOSIS — Z8673 Personal history of transient ischemic attack (TIA), and cerebral infarction without residual deficits: Secondary | ICD-10-CM

## 2010-09-11 DIAGNOSIS — R630 Anorexia: Secondary | ICD-10-CM | POA: Diagnosis present

## 2010-09-11 LAB — GLUCOSE, CAPILLARY
Glucose-Capillary: 136 mg/dL — ABNORMAL HIGH (ref 70–99)
Glucose-Capillary: 158 mg/dL — ABNORMAL HIGH (ref 70–99)
Glucose-Capillary: 207 mg/dL — ABNORMAL HIGH (ref 70–99)
Glucose-Capillary: 40 mg/dL — CL (ref 70–99)
Glucose-Capillary: 83 mg/dL (ref 70–99)
Glucose-Capillary: 90 mg/dL (ref 70–99)
Glucose-Capillary: 93 mg/dL (ref 70–99)

## 2010-09-11 LAB — BASIC METABOLIC PANEL
Calcium: 9.4 mg/dL (ref 8.4–10.5)
Chloride: 111 mEq/L (ref 96–112)
Creatinine, Ser: 5.2 mg/dL — ABNORMAL HIGH (ref 0.4–1.5)
Creatinine, Ser: 3.66 mg/dL — ABNORMAL HIGH (ref 0.4–1.5)
GFR calc Af Amer: 13 mL/min — ABNORMAL LOW (ref >60)
GFR calc Af Amer: 20 mL/min — ABNORMAL LOW (ref >60)
Sodium: 141 mEq/L (ref 135–145)
Sodium: 142 mEq/L (ref 135–145)

## 2010-09-11 LAB — CBC
MCH: 29 pg (ref 26.0–34.0)
Platelets: 482 10*3/uL — ABNORMAL HIGH (ref 150–400)
RBC: 3.31 MIL/uL — ABNORMAL LOW (ref 4.22–5.81)
WBC: 10.2 10*3/uL (ref 4.0–10.5)

## 2010-09-11 LAB — LACTIC ACID, PLASMA: Lactic Acid, Venous: 1.2 mmol/L (ref 0.5–2.2)

## 2010-09-11 LAB — PROCALCITONIN: Procalcitonin: 1.66 ng/mL

## 2010-09-11 NOTE — H&P (Signed)
NAMEJAMIRE, SHABAZZ NO.:  [id number]  MEDICAL RECORD NO.:  [id number]           PATIENT TYPE:  I  LOCATION:  2604                         FACILITY:  [facility]  PHYSICIAN:  Della Goo, M.D. DATE OF BIRTH:  1933/04/26  DATE OF ADMISSION:  09/11/2010 DATE OF DISCHARGE:                             HISTORY & PHYSICAL   PRIMARY CARE PHYSICIAN:  [facility], Dr. Murray Hodgkins.  CHIEF COMPLAINT:  Decreased urine output and abdominal distention.  HISTORY OF PRESENT ILLNESS:  This is a 77 year old male who was sent from an area rehab center to the [facility] Emergency Department secondary to decreased urine output since the a.m. along with urinary retention.  The patient also had increased somnolence and decreased responsiveness over the day.  The patient's son gives the history and the history has also been obtained through the medical records.  The patient is unable to give any history at this time.  The patient's son reports that the patient has a bladder stone which had been obstructing his urine.  In the emergency department, a Foley catheter was placed which relieved more than 2 liters of urine.  His emergency department evaluation also revealed a pneumonia of the right middle lobe.  Laboratory studies also revealed hyperkalemia with a potassium level of 6.0 and an elevated BUN and creatinine at 99/8.50. The patient was started on therapies to cover the pneumonia with IV vancomycin and Zosyn and IV fluids were also ordered and therapy for the hyperkalemia was initiated.  The patient was referred for medical admission and transferred to the [facility] campus.  The patient has also had complaints of poor intake of foods and liquids.  PAST MEDICAL HISTORY:  Significant for: 1. Previous CVAs x3 of the left brain in 1997, 2000, and 2001 and in     2006 as well. 2. Hypertension. 3. Type 2 diabetes mellitus. 4. Coronary artery disease. 5.  Status post right carotid endarterectomy. 6. Status post open reduction and internal fixation of left femoral     neck fracture status post fall on July 26, 2010, by orthopedic     surgeon, Dr. Jene Every.  MEDICATIONS:  Will need to be further verified.  ALLERGIES:  No known drug allergies.  SOCIAL HISTORY:  The patient is currently residing at a rehab center following recovery from his hip surgery.  He has a previous history of smoking.  He is a nondrinker.  No history of illicit drug usage.  FAMILY HISTORY:  Positive for coronary artery disease in his mother. Father had congestive heart failure syndrome.  REVIEW OF SYSTEMS:  Unable to obtain from the patient.  PHYSICAL EXAMINATION FINDINGS:  GENERAL:  This is a pleasantly confused 77 year old well-nourished, well-developed elderly Caucasian male who is in no acute distress currently. VITAL SIGNS:  Temperature 97.6, blood pressure 134/66, heart rate 86, respirations 19, and O2 saturation 98%. HEENT:  Normocephalic and atraumatic.  Pupils are equally round and reactive to light.  Extraocular movements are intact.  Funduscopic benign.  There is no scleral icterus.  Nares are patent bilaterally. Oropharynx is clear. NECK:  Supple.  Full  range of motion.  No thyromegaly, adenopathy, or jugular venous distention. CARDIOVASCULAR:  Regular rate and rhythm.  No murmurs, gallops, or rubs appreciated.  Normal S1 and S2. LUNGS:  Breathing is unlabored and the chest wall excursion is symmetric.  The lung fields are clear to auscultation bilaterally. There are no rales, rhonchi, or wheezes. ABDOMEN:  Positive bowel sounds, soft, nontender, nondistended.  No hepatosplenomegaly. EXTREMITIES:  Without cyanosis, clubbing, or edema. NEUROLOGIC:  Generalized weakness and there are no acute deficits.  LABORATORY STUDIES:  White blood cell count 15.6, hemoglobin 10.1, hematocrit 31.5, platelets 511, neutrophils 91%, lymphocytes 4%,  MCV 91.8.  Sodium 135, potassium 6.0, chloride 101, CO2 of 14, BUN 99, creatinine 8.50, and glucose 70.  Protime 14.6, INR 1.12, and PTT 32. Urinalysis positive for small leukocyte esterase.  Urine micro reveals white blood cells 11-20.  CK total 72, CK-MB 4.4, and troponin less than 0.30.  Chest x-ray reveals right middle lobe infiltrate.  Ultrasound of the left lower extremity is negative for deep venous thrombosis.  ASSESSMENT:  A 77 year old male being admitted with: 1. Pneumonia. 2. Hyperkalemia. 3. Acute renal failure. 4. Metabolic encephalopathy. 5. Urinary retention secondary to bladder stone. 6. Hypoglycemia. 7. Anemia. 8. Anorexia.  PLAN:  Admit to the Step-Down ICU area.  Blood cultures x2 have been ordered and antibiotic therapy of IV vancomycin and Zosyn have been ordered.  Nebulizer treatments and supplemental oxygen have also been ordered and gentle IV fluids as well.  The patient will be started on Kayexalate therapy secondary to the elevated potassium level.  A repeat check of the potassium level will be performed in 6 hours.  Orders have been written to call the rounding physician with these results for further orders if needed.  A Foley catheter has been placed to relieve the patient's urinary obstruction and a renal ultrasound study has been ordered, and the patient will be placed on the hypoglycemic protocol and the patient will also be monitored for hyperglycemia as well.  The patient is a full code for now.     Della Goo, M.D.     HJ/MEDQ  D:  09/11/2010  T:  09/11/2010  Job:  [id number]  Electronically Signed by Della Goo M.D. on 09/11/2010 07:51:53 PM

## 2010-09-12 LAB — CBC
Hemoglobin: 9.6 g/dL — ABNORMAL LOW (ref 13.0–17.0)
MCHC: 31.1 g/dL (ref 30.0–36.0)
RDW: 16 % — ABNORMAL HIGH (ref 11.5–15.5)

## 2010-09-12 LAB — BASIC METABOLIC PANEL
CO2: 22 mEq/L (ref 19–32)
Calcium: 9.1 mg/dL (ref 8.4–10.5)
GFR calc Af Amer: 37 mL/min — ABNORMAL LOW (ref >60)
GFR calc non Af Amer: 30 mL/min — ABNORMAL LOW (ref >60)
Sodium: 142 mEq/L (ref 135–145)

## 2010-09-12 LAB — GLUCOSE, CAPILLARY
Glucose-Capillary: 176 mg/dL — ABNORMAL HIGH (ref 70–99)
Glucose-Capillary: 136 mg/dL — ABNORMAL HIGH (ref 70–99)

## 2010-09-13 LAB — CBC
HCT: 30.1 % — ABNORMAL LOW (ref 39.0–52.0)
MCHC: 30.2 g/dL (ref 30.0–36.0)
MCV: 95.3 fL (ref 78.0–100.0)
RDW: 16 % — ABNORMAL HIGH (ref 11.5–15.5)

## 2010-09-13 LAB — BASIC METABOLIC PANEL
BUN: 19 mg/dL (ref 6–23)
Calcium: 9.3 mg/dL (ref 8.4–10.5)
GFR calc non Af Amer: 55 mL/min — ABNORMAL LOW (ref >60)
Glucose, Bld: 117 mg/dL — ABNORMAL HIGH (ref 70–99)

## 2010-09-13 LAB — GLUCOSE, CAPILLARY: Glucose-Capillary: 136 mg/dL — ABNORMAL HIGH (ref 70–99)

## 2010-09-14 LAB — GLUCOSE, CAPILLARY: Glucose-Capillary: 93 mg/dL (ref 70–99)

## 2010-09-17 LAB — CULTURE, BLOOD (ROUTINE X 2): Culture: NO GROWTH

## 2010-09-22 NOTE — Discharge Summary (Signed)
NAMEJOSHUA, [name]            ACCOUNT NO.:  [id number]  MEDICAL RECORD NO.:  [id number]           PATIENT TYPE:  I  LOCATION:  6736                         FACILITY:  [facility]  PHYSICIAN:  Conley Canal, MD      DATE OF BIRTH:  07-19-1932  DATE OF ADMISSION:  09/11/2010 DATE OF DISCHARGE:                              DISCHARGE SUMMARY   PRIMARY CARE PHYSICIAN:  Lenon Curt. Chilton Si, MD  UROLOGIST:  Lindaann Slough, MD  ADMIT DIAGNOSES: 1. Acute renal failure secondary to urinary retention. 2. Obstructive uropathy secondary to bladder stone. 3. Decreased level of consciousness secondary to metabolic     encephalopathy. 4. Pneumonia, possible aspiration versus healthcare associated. 5. Hyperkalemia. 6. Hypoglycemia. 7. Anemia. 8. Anorexia.  The patient also has a history of indirect inguinal     hernia, cerebrovascular accident x3, hypertension, coronary artery     disease and type 2 diabetes. 9. Status post open reduction internal fixation for hip fracture on     July 26, 2010.  CONDITION ON DISCHARGE:  The patient is alert and oriented.  He is not eating particularly well, but he is weightbearing.  His acute renal failure has now resolved and he is ready for discharge to skilled nursing facility [facility]  HISTORY AND BRIEF HOSPITAL COURSE:  [name] is a very pleasant 77- year-old male who was sent from [facility] to [facility] Emergency Department secondary to decreased urine output in the morning of May 12.  The patient had had increased somnolence and decreased responsiveness over the previous day.  The patient's son reported that he had a bladder stone which has been obstructing.  In the emergency department on arrival, the Foley catheter was placed which relieved more than 2 L of urine.  Emergency Department evaluation also revealed pneumonia in the right middle lobe and hyperkalemia with a potassium of 6 and an elevated BUN of 99,  creatinine 8.5.  The patient was admitted.  He was started on broad-spectrum antibiotics including vancomycin and Zosyn as well as IV fluids.  Blood cultures were drawn prior to antibiotics being given.  Foley catheter was placed and left in throughout the admission secondary to urinary retention.  Just prior to discharge, and a voiding test was done.  Foley catheter was removed. The patient was found to have urinary retention once again. Consequently, his Foley catheter will be left in place until he sees Dr. Brunilda Payor on Wednesday May 16.  Over the course of his brief hospital stay, his mental status cleared, although he does have chronic dysarthria secondary to history of CVA, also his groin and penile pain resolved, and with the Foley in place, his BUN and creatinine on May 14 are now 19 and 1.28 respectively.  PHYSICAL EXAMINATION:  GENERAL:  At the time of discharge, the patient is sleepy but awakens easily. VITAL SIGNS:  Temperature 98.8, pulse 101, respirations 20, blood pressure 146/53, oxygen saturation is 95% on room air. HEAD:  Atraumatic normocephalic. EYES:  Anicteric with pupils that are equal, round. NOSE:  No nasal discharge or exterior lesions. MOUTH:  He has red Jell-O that  has drooled down the outside of his mouth.  He has poor dentition and moist mucous membranes.  NECK:  Supple with midline trachea.  No JVD.  No lymphadenopathy. CHEST:  Demonstrates no accessory muscle use.  No wheezes or crackles to my auscultation. HEART:  Tachycardic without murmur, rub, or gallop. ABDOMEN:  Soft, nontender, nondistended with bowel sounds. EXTREMITIES:  He has left lower extremity 1 to 2+ pitting edema.  No right lower extremity edema. NEUROLOGIC:  He is alert and oriented.  He has some trouble being understood because of speech and residual speech difficulty from previous strokes.  Otherwise, he has no focal neuro deficits. PSYCHIATRIC:  He appears in no apparent distress.  He is  pleasant, cooperative.  Grooming is moderate.  Foley catheter remains in place. For seizures, the patient had a Foley catheter placed on admission, otherwise there were no procedures.  IMAGING:  He had a renal ultrasound done on Sep 11, 2010, that showed mild right-sided hydronephrosis.  There may be cyst in the upper pole of the right kidney.  Follow-up ultrasound could be used to ensure resolution.  There is some echogenic material in the decompressed bladder.  This may be related to infectious debris or hemorrhage.  The patient had Doppler ultrasounds of his left lower extremity.  Because of edema, there was no evidence for DVT of his left lower extremity.  He had a chest x-ray on May 11 that showed patchy airspace disease in the right medial base compatible with atelectasis or infiltrate.  PERTINENT LABS:  On admission, the patient's white count was 15.6, today has normalized to 7.8.  His hemoglobin on admission was 10.1, hematocrit 31.5, platelets 511.  Sodium 136, potassium 6.0, chloride 101, bicarb 14, BUN 99, creatinine 8.5, glucose was 70, total bilirubin 0.2, alkaline phosphatase 121, AST 59, ALT 70, serum albumin 2.8, CK-MB was elevated at 4.4.  Urinary analysis showed wbc's 11-20 with small leukocytes.  Positive for mild infection.  Blood cultures were drawn. The preliminary results showed no growth.  DISCHARGE MEDICATIONS: 1. Tamsulosin 0.4 mg 1 tablet daily at bedtime. 2. Ambien 5 mg 1 tablet daily at bedtime for insomnia if needed. 3. Aspirin 81 mg 1 tablet daily. 4. Dilaudid 2 mg 1 tablet by mouth every 4 hours as needed for severe     pain. 5. Lumigan 1 drop in both eyes daily at bedtime. 6. MiraLax 17 g once by mouth daily. 7. Nifedipine XR 60 mg 1 tablet by mouth daily at bedtime. 8. Paxil 20 mg 1 tablet by mouth daily at bedtime. 9. Ranitidine 150 mg 1 tablet twice daily. 10.Rocephin 1 g per day for 10 days, injected intramuscularly,     starting on Sep 10, 2010, and we will finish on Sep 20, 2010. 11.Senokot 2 tablets by mouth daily at bedtime. 12.Simvastatin 40 mg 1 tablet by mouth daily at bedtime.  DISCHARGE INSTRUCTIONS:  The patient is going to a skilled nursing facility.  He does have a followup appointment with Dr. Jamison Neighbor on May 16 at 10 a.m., telephone number [phone].  The patient is to increase his activity slowly.  He will have physical therapy at [facility].  He is to return to a carb-modified diet and primary follow up with Dr. Murray Hodgkins as needed.     Stephani Police, PA   ______________________________ Conley Canal, MD    MLY/MEDQ  D:  09/13/2010  T:  09/13/2010  Job:  [id number]  cc:   Merton Border  G. Chilton Si, M.D. Lindaann Slough, M.D.  Electronically Signed by Algis Downs PA on 09/20/2010 04:18:57 PM Electronically Signed by Conley Canal  on 09/22/2010 05:41:41 PM

## 2010-10-01 DIAGNOSIS — N210 Calculus in bladder: Secondary | ICD-10-CM

## 2010-10-01 HISTORY — PX: TRANSURETHRAL RESECTION OF PROSTATE: SHX73

## 2010-10-01 HISTORY — DX: Calculus in bladder: N21.0

## 2010-10-12 ENCOUNTER — Other Ambulatory Visit: Payer: Self-pay | Admitting: Urology

## 2010-10-12 ENCOUNTER — Ambulatory Visit (HOSPITAL_COMMUNITY)
Admission: RE | Admit: 2010-10-12 | Discharge: 2010-10-13 | Disposition: A | Discharge status: Nursing Home (Includes ICF) | Payer: Medicare Other | Source: Ambulatory Visit | Attending: Urology | Admitting: Urology

## 2010-10-12 ENCOUNTER — Ambulatory Visit (HOSPITAL_COMMUNITY): Payer: Medicare Other

## 2010-10-12 DIAGNOSIS — Z8673 Personal history of transient ischemic attack (TIA), and cerebral infarction without residual deficits: Secondary | ICD-10-CM | POA: Insufficient documentation

## 2010-10-12 DIAGNOSIS — N401 Enlarged prostate with lower urinary tract symptoms: Secondary | ICD-10-CM | POA: Insufficient documentation

## 2010-10-12 DIAGNOSIS — E119 Type 2 diabetes mellitus without complications: Secondary | ICD-10-CM | POA: Insufficient documentation

## 2010-10-12 DIAGNOSIS — Z7982 Long term (current) use of aspirin: Secondary | ICD-10-CM | POA: Insufficient documentation

## 2010-10-12 DIAGNOSIS — N210 Calculus in bladder: Secondary | ICD-10-CM | POA: Insufficient documentation

## 2010-10-12 DIAGNOSIS — J4489 Other specified chronic obstructive pulmonary disease: Secondary | ICD-10-CM | POA: Insufficient documentation

## 2010-10-12 DIAGNOSIS — R29898 Other symptoms and signs involving the musculoskeletal system: Secondary | ICD-10-CM | POA: Insufficient documentation

## 2010-10-12 DIAGNOSIS — J449 Chronic obstructive pulmonary disease, unspecified: Secondary | ICD-10-CM | POA: Insufficient documentation

## 2010-10-12 DIAGNOSIS — N138 Other obstructive and reflux uropathy: Secondary | ICD-10-CM | POA: Insufficient documentation

## 2010-10-12 DIAGNOSIS — I10 Essential (primary) hypertension: Secondary | ICD-10-CM | POA: Insufficient documentation

## 2010-10-12 DIAGNOSIS — Z79899 Other long term (current) drug therapy: Secondary | ICD-10-CM | POA: Insufficient documentation

## 2010-10-12 DIAGNOSIS — F0390 Unspecified dementia without behavioral disturbance: Secondary | ICD-10-CM | POA: Insufficient documentation

## 2010-10-12 LAB — GLUCOSE, CAPILLARY

## 2010-10-12 LAB — BASIC METABOLIC PANEL
CO2: 26 mEq/L (ref 19–32)
Chloride: 105 mEq/L (ref 96–112)
Creatinine, Ser: 1.05 mg/dL (ref 0.4–1.5)
GFR calc Af Amer: >60 mL/min (ref >60)
GFR calc non Af Amer: >60 mL/min (ref >60)
Potassium: 4.4 mEq/L (ref 3.5–5.1)

## 2010-10-12 LAB — CBC
MCV: 95.5 fL (ref 78.0–100.0)
Platelets: 252 10*3/uL (ref 150–400)
RBC: 3.98 MIL/uL — ABNORMAL LOW (ref 4.22–5.81)
WBC: 6.1 10*3/uL (ref 4.0–10.5)

## 2010-10-13 LAB — BASIC METABOLIC PANEL
CO2: 25 mEq/L (ref 19–32)
Calcium: 9.3 mg/dL (ref 8.4–10.5)
Chloride: 105 mEq/L (ref 96–112)
Glucose, Bld: 104 mg/dL — ABNORMAL HIGH (ref 70–99)
Sodium: 138 mEq/L (ref 135–145)

## 2010-10-13 LAB — CBC
Hemoglobin: 10.6 g/dL — ABNORMAL LOW (ref 13.0–17.0)
MCH: 29.4 pg (ref 26.0–34.0)
MCV: 94.2 fL (ref 78.0–100.0)
RBC: 3.6 MIL/uL — ABNORMAL LOW (ref 4.22–5.81)
WBC: 7.5 10*3/uL (ref 4.0–10.5)

## 2010-10-23 NOTE — Op Note (Signed)
[name], MCEVERS NO.:  [id number]  MEDICAL RECORD NO.:  [id number]  LOCATION:  DAYL                         FACILITY:  [facility]  PHYSICIAN:  Danae Chen, M.D.  DATE OF BIRTH:  05/06/1932  DATE OF PROCEDURE:  10/12/2010 DATE OF DISCHARGE:                              OPERATIVE REPORT   PREOPERATIVE DIAGNOSES:  Urinary retention, bladder stone, benign prostatic hypertrophy.  POSTOPERATIVE DIAGNOSES:  Urinary retention, bladder stone, benign prostatic hypertrophy.  PROCEDURES:  Cystoscopy, electrohydraulic lithotripsy of bladder stone, bladder stone extraction, transurethral resection of prostate and insertion of suprapubic tube.  SURGEON:  Danae Chen, M.D.  ANESTHESIA:  Spinal.  INDICATIONS:  The patient is a 77 year old male who was seen in the emergency room about a month ago in acute urinary retention.  A Foley catheter inserted in the bladder drained 2000 cc of urine.  His creatinine was 8.5, BUN 99.  Creatinine and BUN went down to 1.2 and 19 with catheter drainage.  Ultrasound showed echogenic debris in the bladder.  Cystoscopy revealed a foreign body in the bladder which could be either blood clot or the bladder stone.  He is scheduled today for cystoscopy, EHL bladder stone, TURP and insertion of SP tube.  The patient was identified by his wristband and proper time-out was taken.  Under spinal anesthesia, the patient was prepped and draped and placed in the dorsal lithotomy position.  A cystoscope was inserted in the bladder.  The anterior urethra is normal.  There is trilobar prostatic hypertrophy.  The bladder is trabeculated with cellules and diverticula. There is a large stone in the bladder that measures about at least 5 cm. There is no tumor in the bladder.  The ureteral orifices are in normal position and shape.  Then with the electrohydraulic lithotripsy the bladder stone was fragmented in multiple stone fragments and all  stone fragments were removed out of the bladder with the [facility] evacuator.  The urethra was sequentially dilated up to #30-French with Sissy Hoff sounds, then a #28 continuous flow Iglesias resectoscope was inserted in the bladder.  Resection of the prostate gland was done between the 5 and 7 o'clock positions.  Then the resection was done between the 2 and the 5 o'clock and between the 7 and the 10 o'clock positions using the bladder neck and the verumontanum as landmarks.  Then the resection was completed between the 10 and 2 o'clock positions.  Hemostasis was secured with electrocautery.  The prostatic chips were then irrigated out of the bladder.  There was minimal bleeding at the end of the procedure.  The bladder was then filled with normal saline and a Lowsley sound was passed through the urethra in the bladder.  With the patient in Trendelenburg position, a stab wound was made in the suprapubic area and the Lowsley sound was passed through the stab wound incision.  A #18- French Foley catheter was then passed between the jaws of the Lowsley sound and pulled in the urethra.  Then under direct vision the Foley catheter was pulled back in the bladder.  The balloon of the Foley catheter was then inflated 10 cc.  The Foley was in good position in  the bladder.  A #24 three-way Foley catheter was then passed in the bladder.  The bladder was irrigated with normal saline and the return was clear.  The patient tolerated the procedure well and left the OR in satisfactory condition to postanesthesia care unit.     Danae Chen, M.D.     MN/MEDQ  D:  10/12/2010  T:  10/12/2010  Job:  [id number]  cc:   Dr. Nicky Pugh, M.D. Fax: [phone]  Electronically Signed by Lindaann Slough M.D. on 10/23/2010 01:52:52 PM

## 2010-10-23 NOTE — Discharge Summary (Signed)
  NAMELEILAND, MIHELICH NO.:  [id number]  MEDICAL RECORD NO.:  [id number]  LOCATION:  1423                         FACILITY:  [facility]  PHYSICIAN:  Danae Chen, M.D.  DATE OF BIRTH:  1932/09/17  DATE OF ADMISSION:  10/12/2010 DATE OF DISCHARGE:  10/13/2010                              DISCHARGE SUMMARY   DISCHARGE DIAGNOSES:  Bladder stone, urinary retention, renal insufficiency, benign prostatic hypertrophy.  PROCEDURE PERFORMED:  Cystoscopy, EHL bladder stone, TURP, and insertion of suprapubic tube.  HOSPITAL COURSE:  The patient is 77 year old male who went into urinary retention about a month ago.  The Foley catheter inserted in the bladder drained about 2000 cc of urine.  BUN and creatinine were markedly elevated at 99 and 8.5.  After catheter drainage, they both went down to 19 and 1.2.  Ultrasound showed echogenic debris in the bladder. Cystoscopy showed a foreign body in the bladder suggestive of the bladder stone.  He had a cystoscopy, EHL bladder stone, stone extraction TURP and SP tube placement on October 12, 2010.  His postoperative course was uneventful.  He remained afebrile.  He tolerated his diet well.  The Foley catheter was removed on October 13, 2010.  He has not voided yet, however, he has an SP tube and if he is unable to void he will use the SP tube to drain the bladder.  His hemoglobin preoperatively was 11.7, hematocrit 38, WBC 6.1.  Postop his hemoglobin was 10.6, hematocrit 33.9, WBC 7.5.  His BUN on October 12, 2010, was 23, creatinine 1.05.  On October 13, 2010, his BUN was 18, creatinine 0.88.  His blood glucose on October 13, 2010, was up to 104.  He was discharged to [facility] on October 13, 2010.  He may resume his prehospital activities at the nursing home.  CONDITION ON DISCHARGE:  Improved.  DISCHARGE DIET:  Regular.     Danae Chen, M.D.     MN/MEDQ  D:  10/13/2010  T:  10/13/2010  Job:  [id number]  cc:   Jene Every, M.D. Fax:  [phone]  Holley Bouche, M.D. Fax: [phone]  Electronically Signed by Lindaann Slough M.D. on 10/23/2010 01:53:43 PM

## 2010-10-23 NOTE — H&P (Signed)
NAMEKAULIN, CHAVES NO.:  [id number]  MEDICAL RECORD NO.:  [id number]  LOCATION:  DAYL                         FACILITY:  [facility]  PHYSICIAN:  Danae Chen, M.D.  DATE OF BIRTH:  October 31, 1932  DATE OF ADMISSION:  10/12/2010 DATE OF DISCHARGE:                             HISTORY & PHYSICAL   CHIEF COMPLAINT:  Inability to urinate and bladder stone.  HISTORY:  The patient is 77 years old male who was seen in the emergency room about a month ago in acute urinary retention.  A Foley catheter inserted in the bladder drained about 2000 mL of urine.  Creatinine, at that time, was 8.5 and BUN 99.  BUN and creatinine went down to 19 and 1.2 three days later with catheter drainage.  Ultrasound showed echogenic debris in the bladder.  Cystoscopy showed a foreign body in the bladder.  He had a stroke in 2000 and had been having urinary incontinence after that.  He is status post left hip hemiarthroplasty on March 26 for hip fracture.  He is scheduled today for cystoscopy, bladder stone and TURP and insertion of suprapubic tube.  PAST MEDICAL HISTORY:  Positive for diabetes, glaucoma, hypercholesterolemia, hypertension, TIA and stroke and depression.  PAST SURGICAL HISTORY:  He had a left hip surgery on March 26 and bilateral cataract surgery.  MEDICATION: 1. Ambien 5 mg. 2. Aspirin 81 mg. 3. Dilaudid 2 mg p.r.n.. 4. Lumigan solution. 5. MiraLax. 6. Nifedipine. 7. Paxil. 8. Ranitidine. 9. Senokot. 10.Simvastatin. 11.Flomax.  ALLERGIES:  No known drug allergies.  FAMILY HISTORY:  Positive for diabetes and heart disease.  His mother died at age 75.  There is a family history of cancer in his mother.  His father died at age 43 of congestive heart failure.  SOCIAL HISTORY:  He has 1 son, 1 daughter.  He has smoked 2 packs a day for over 50 years and quit about 3 months ago and drinks moderately.  REVIEW OF SYSTEMS:  As noted in the HPI and everything else is  negative.  PHYSICAL EXAMINATION:  GENERAL:  This is a well-developed 77 year old male who is in no acute distress at this time.  He is alert and oriented. VITAL SIGNS:  Blood pressure is 129/69, pulse 80, respirations 16, temperature 97.9. HEENT:  Head is normal.  He has pink conjunctivae.  Ears, nose and throat are within normal limits. NECK:  Supple.  No cervical adenopathy.  No thyromegaly. CHEST:  Symmetrical.  Lungs are fully expanded and clear to percussion and auscultation. HEART:  Regular rhythm. ABDOMEN:  Soft, nondistended, nontender.  He has no CVA tenderness. Kidneys are not palpable.  He has no inguinal hernia.  No inguinal adenopathy.  Bowel sounds are normal. GENITALIA:  Penis is circumcised.  He has an indwelling Foley catheter that is draining clear urine.  The scrotum is normal.  He has no hydrocele.  No testicular mass.  Cords and epididymis are within normal limits. RECTAL:  Sphincter tone is normal.  Prostate is enlarged at 50 grams. No nodules.  Seminal vesicles not palpable. EXTREMITIES:  Within normal limits.  IMPRESSION: 1. Bladder stone. 2. Benign prostatic hypertrophy. 3. Urinary retention.  Danae Chen, M.D.     MN/MEDQ  D:  10/12/2010  T:  10/12/2010  Job:  [id number]  Electronically Signed by Lindaann Slough M.D. on 10/23/2010 01:51:08 PM

## 2011-01-31 DIAGNOSIS — K4090 Unilateral inguinal hernia, without obstruction or gangrene, not specified as recurrent: Secondary | ICD-10-CM

## 2011-01-31 HISTORY — DX: Unilateral inguinal hernia, without obstruction or gangrene, not specified as recurrent: K40.90

## 2011-02-16 ENCOUNTER — Ambulatory Visit (INDEPENDENT_AMBULATORY_CARE_PROVIDER_SITE_OTHER): Payer: Medicare Other | Admitting: Surgery

## 2011-02-16 ENCOUNTER — Encounter (INDEPENDENT_AMBULATORY_CARE_PROVIDER_SITE_OTHER): Payer: Self-pay | Admitting: Surgery

## 2011-02-16 DIAGNOSIS — K4020 Bilateral inguinal hernia, without obstruction or gangrene, not specified as recurrent: Secondary | ICD-10-CM

## 2011-02-16 NOTE — Patient Instructions (Signed)
Inguinal Hernia, Adult Muscles help keep everything in the body in its proper place. But if a weak spot in the muscles develops, something can poke through. That is called a hernia. When this happens in the lower part of the belly (abdomen), it is called an inguinal hernia. (It takes its name from a part of the body in this region called the inguinal canal.) A weak spot in the wall of muscles lets some fat or part of the small intestine bulge through. An inguinal hernia can develop at any age. Men get them more often than women. CAUSES In adults, an inguinal hernia develops over time.  It can be triggered by:   Suddenly straining the muscles of the lower abdomen.   Lifting heavy objects.   Straining to have a bowel movement. Difficult bowel movements (constipation) can lead to this.   Constant coughing. This may be caused by smoking or lung disease.   Being overweight.   Being pregnant.   Working at a job that requires long periods of standing or heavy lifting.   Having had an inguinal hernia before.  One type can be an emergency situation. It is called a strangulated inguinal hernia. It develops if part of the small intestine slips through the weak spot and cannot get back into the abdomen. The blood supply can be cut off. If that happens, part of the intestine may die. This situation requires emergency surgery. SYMPTOMS Often, a small inguinal hernia has no symptoms. It is found when a healthcare provider does a physical exam. Larger hernias usually have symptoms.   In adults, symptoms may include:   A lump in the groin. This is easier to see when the person is standing. It might disappear when lying down.   In men, a lump in the scrotum.   Pain or burning in the groin. This occurs especially when lifting, straining or coughing.   A dull ache or feeling of pressure in the groin.   Signs of a strangulated hernia can include:   A bulge in the groin that becomes very painful and  tender to the touch.   A bulge that turns red or purple.   Fever, nausea and vomiting.   Inability to have a bowel movement or to pass gas.  DIAGNOSIS To decide if you have an inguinal hernia, a healthcare provider will probably do a physical examination.  This will include asking questions about any symptoms you have noticed.   The healthcare provider might feel the groin area and ask you to cough. If an inguinal hernia is felt, the healthcare provider may try to slide it back into the abdomen.   Usually no other tests are needed.  TREATMENT Treatments can vary. The size of the hernia makes a difference. Options include:  Watchful waiting. This is often suggested if the hernia is small and you have had no symptoms.   No medical procedure will be done unless symptoms develop.   You will need to watch closely for symptoms. If any occur, contact your healthcare provider right away.   Surgery. This is used if the hernia is larger or you have symptoms.   Open surgery. This is usually an outpatient procedure (you will not stay overnight in a hospital). An cut (incision) is made through the skin in the groin. The hernia is put back inside the abdomen. The weak area in the muscles is then repaired by:  --Herniorrhaphy. In this type of surgery, the weak muscles are sewn   back together. --Hernioplasty. A patch or mesh is used to close the weak area in the abdominal wall.   Laparoscopy. In this procedure, a surgeon makes small incisions. A thin tube with a tiny video camera (called a laparoscope) is put into the abdomen. The surgeon repairs the hernia with mesh by looking with the video camera and using two long instruments.  HOME CARE INSTRUCTIONS  After surgery to repair an inguinal hernia:   You will need to take pain medicine prescribed by your healthcare provider. Follow all directions carefully.   You will need to take care of the wound from the incision.   Your activity will be  restricted for awhile. This will probably include no heavy lifting for several weeks. You also should not do anything too active for a few weeks. When you can return to work will depend on the type of job that you have.   During "watchful waiting" periods, you should:   Maintain a healthy weight.   Eat a diet high in fiber (fruits, vegetables and whole grains).   Drink plenty of fluids to avoid constipation. This means drinking enough water and other liquids to keep your urine clear or pale yellow.   Do not lift heavy objects.   Do not stand for long periods of time.   Quit smoking. This should keep you from developing a frequent cough.  SEEK MEDICAL CARE IF:  A bulge develops in your groin area.   You feel pain, a burning sensation or pressure in the groin. This might be worse if you are lifting or straining.   You develop a fever of more than 100.5F (38.1 C).  SEEK IMMEDIATE MEDICAL CARE IF:  Pain in the groin increases suddenly.   A bulge in the groin gets bigger suddenly and does not go down.   For men, there is sudden pain in the scrotum. Or, the size of the scrotum increases.   A bulge in the groin area becomes red or purple and is painful to touch.   You have nausea or vomiting that does not go away.   You feel your heart beating much faster than normal.   You cannot have a bowel movement or pass gas.   You develop a fever of more than 102.0F (38.9C).  Document Released: 09/04/2008 Document Re-Released: 10/06/2009 [employer] Patient Information 2011 [employer]. 

## 2011-02-16 NOTE — Progress Notes (Signed)
Subjective:     Patient ID: [name], male   DOB: 12-25-1932, 77 y.o.   MRN: [id number]  HPI  Patient Care Team: Lolita Patella as PCP - General (Family Medicine) Lindaann Slough, MD as Consulting Physician (Urology)  This patient is a 77 y.o.male who presents today for surgical evaluation.   Patient is a retired male who's had a right groin swelling for over 4 years. It has increased in size and become uncomfortable. He wished to have it fixed. He had a UTI and bladder stone. He required cystoscopy and TURP to resolve that in June by Dr. Brunilda Payor with Urology. He urinates fine now without any urinary retention. He's never had any prior hernia surgeries. He denies abdominal surgery.  He did have a left hip fracture and tends to walk with a cane shuffle gait. He tells me to walk 5 yards without difficulty. No exertional chest pain. No cardiac events.  BMs daily.  Past Medical History  Diagnosis Date  . Right inguinal hernia october 2012  . Diabetes mellitus   . Hypertension   . Glaucoma   . Colon polyps   . Osteoporosis     spine  . GERD (gastroesophageal reflux disease)   . Hand eczema   . Hyperlipidemia   . Stroke   . Aphasia   . Depression   . BPH (benign prostatic hyperplasia)   . Bladder stones 10/2010    History reviewed. No pertinent past surgical history.  History   Social History  . Marital Status: Married    Spouse Name: N/A    Number of Children: N/A  . Years of Education: N/A   Occupational History  . Not on file.   Social History Main Topics  . Smoking status: Current Everyday Smoker    Types: Pipe  . Smokeless tobacco: Never Used  . Alcohol Use: No  . Drug Use: No  . Sexually Active:    Other Topics Concern  . Not on file   Social History Narrative  . No narrative on file    History reviewed. No pertinent family history.  Current outpatient prescriptions:HYDROmorphone (DILAUDID) 2 MG tablet, Take 2 mg by mouth every 4 (four)  hours as needed.  , Disp: , Rfl: ;  LUMIGAN 0.03 % ophthalmic solution, , Disp: , Rfl: ;  NIFEdipine (PROCARDIA XL/ADALAT-CC) 60 MG 24 hr tablet, , Disp: , Rfl: ;  PARoxetine (PAXIL) 20 MG tablet, , Disp: , Rfl: ;  polyethylene glycol (MIRALAX / GLYCOLAX) packet, Take 17 g by mouth daily.  , Disp: , Rfl:  ranitidine (ZANTAC) 150 MG tablet, , Disp: , Rfl: ;  senna (SENOKOT) 8.6 MG TABS, Take 2 tablets by mouth daily.  , Disp: , Rfl: ;  simvastatin (ZOCOR) 10 MG tablet, , Disp: , Rfl: ;  zolpidem (AMBIEN) 10 MG tablet, Take 10 mg by mouth at bedtime as needed.  , Disp: , Rfl:   No Known Allergies     Review of Systems  Constitutional: Negative for fever, chills and diaphoresis.  HENT: Positive for hearing loss. Negative for ear pain, nosebleeds, sore throat, facial swelling, mouth sores, trouble swallowing, tinnitus and ear discharge.   Eyes: Negative for photophobia, discharge and visual disturbance.  Respiratory: Negative for choking, chest tightness, shortness of breath and stridor.   Cardiovascular: Negative for chest pain, palpitations and leg swelling.  Gastrointestinal: Negative for nausea, vomiting, abdominal pain, diarrhea, constipation, blood in stool, abdominal distention, anal bleeding and rectal pain.  Genitourinary:  Positive for scrotal swelling. Negative for dysuria, urgency, frequency, flank pain, decreased urine volume, penile swelling, enuresis, difficulty urinating, penile pain and testicular pain.  Musculoskeletal: Positive for arthralgias and gait problem. Negative for myalgias and back pain.  Skin: Negative for color change, pallor, rash and wound.  Neurological: Negative for dizziness, tremors, speech difficulty, weakness, light-headedness, numbness and headaches.  Hematological: Negative for adenopathy. Does not bruise/bleed easily.  Psychiatric/Behavioral: Negative for hallucinations, confusion and agitation. The patient is not nervous/anxious.        Objective:    Physical Exam  Constitutional: He is oriented to person, place, and time. He appears well-developed and well-nourished. No distress.  HENT:  Head: Normocephalic.  Right Ear: External ear normal. Decreased hearing is noted.  Left Ear: External ear normal. Decreased hearing is noted.  Mouth/Throat: Oropharynx is clear and moist. No oropharyngeal exudate.  Eyes: Conjunctivae and EOM are normal. Pupils are equal, round, and reactive to light. No scleral icterus.  Neck: Normal range of motion. Neck supple. No tracheal deviation present.  Cardiovascular: Normal rate, regular rhythm and intact distal pulses.   Pulmonary/Chest: Effort normal and breath sounds normal. No respiratory distress. He has no wheezes. He has no rales. He exhibits no tenderness.  Abdominal: Soft. Bowel sounds are normal. He exhibits no distension and no mass. There is no tenderness. There is no rebound and no guarding.  Genitourinary: Penis normal.    No penile tenderness.  Musculoskeletal: He exhibits no edema and no tenderness.       Walks with a cane, shuffle gait due to stiff left hip  Lymphadenopathy:    He has no cervical adenopathy.       Right: No inguinal adenopathy present.       Left: No inguinal adenopathy present.  Neurological: He is alert and oriented to person, place, and time. No cranial nerve deficit. He exhibits normal muscle tone. Coordination normal.  Skin: Skin is warm and dry. No rash noted. He is not diaphoretic. No erythema. No pallor.  Psychiatric: He has a normal mood and affect. His behavior is normal. Judgment and thought content normal.       Assessment:     BIH, R>L     Plan:     I think he would benefit from repair. Although his gait is slow he seems to have decent physical activity with no cardio pulmonary events.  She would benefit from mesh on both sides unless the left side was completely normal. He is very interested in pursuing this.  The anatomy & physiology of the abdominal  wall was discussed.  The pathophysiology of hernias was discussed.  Natural history risks without surgery of enlargement, pain, incarceration & strangulation was discussed.   Contributors to complications such as smoking, obesity, diabetes, prior surgery, etc were discussed.  I feel the risks of no intervention will lead to serious problems that outweigh the operative risks; therefore, I recommended surgery to reduce and repair the hernia.  I explained laparoscopic techniques with possible need for an open approach.  I noted the probable use of mesh to patch and/or buttress hernia repair.  Risks such as bleeding, infection, abscess, need for further treatment, heart attack, death, and other risks were discussed.  Goals of post-operative recovery were discussed as well.  Possibility that this will not correct all symptoms was explained.  Likelihood it will correct the problem as well I stressed the importance of low-impact activity, aggressive pain control, avoiding constipation, & not pushing through pain  to minimize risk of post-operative chronic pain or injury. Possibility of reherniation was discussed.  We will work to minimize complications.   An educational handout further explaining the pathology & treatment options was given as well.  Questions were answered.  The patient expresses understanding & wishes to proceed with surgery.

## 2011-03-01 ENCOUNTER — Telehealth (INDEPENDENT_AMBULATORY_CARE_PROVIDER_SITE_OTHER): Payer: Self-pay

## 2011-03-01 NOTE — Telephone Encounter (Signed)
Returned pt's voicemail message about her father having surgery this Friday by Dr Michaell Cowing and concerned about the general anesthesia. The daughter was concerned about the pt b/c having a hx of strokes and being a smoker getting general anesthesia she is really asking about spinal anesthesia. The pt had 2 other procedures this year with spinal and she wanted to know how Dr Michaell Cowing felt about this anesthesia. I told her that I would discuss these issues with Dr Michaell Cowing and call her back./ [facility]

## 2011-03-01 NOTE — Telephone Encounter (Signed)
Called pt's daughter back about the issues with her father having surgery this Friday 03-04-11 for bilateral ing. Hernia repair. Per Dr Michaell Cowing the pt can get medical clearance from his PCP before his surgery. The daughter advised me that the PCP is Dr Holley Bouche so I faxed a request for medical clearance ASAP since the sx is on Friday. I did advise the daughter that if we don't receive clearance by Thursday that we might have to reschedule the surgery till we receive clearance. Also, I did ask DR Gross about the spinal and he doesn't do laparascopic hernia repairs with spinal only general anesthesia if Dr Tiburcio Pea does clear the pt for sx then the pt will be ok with general for surgery. I advised the daughter that I will call her to let her know when he has been cleared./ [facility]

## 2011-03-02 ENCOUNTER — Telehealth (INDEPENDENT_AMBULATORY_CARE_PROVIDER_SITE_OTHER): Payer: Self-pay

## 2011-03-02 NOTE — Telephone Encounter (Signed)
LMOM notifying pt's daughter that we did receive medical clearance on pt's surgery scheduled for 03-04-11. I will fax a copy to the hospital to attach to surgical orders.Johnna Acosta

## 2011-03-03 ENCOUNTER — Telehealth (INDEPENDENT_AMBULATORY_CARE_PROVIDER_SITE_OTHER): Payer: Self-pay | Admitting: General Surgery

## 2011-03-03 NOTE — Telephone Encounter (Signed)
DR. Donney Dice FROM [facility] CALLED TO TELL DR. GROSS THAT PT [name] WAS NOT A GOOD SURGICAL CANDIDATE FOR SURGERY AT [facility] DUE TO LUNG ISSUES, AND STROKE HISTORY. HE FELT PT NEEDED TO STAY OVERNIGHT IN A HOSPITAL SETTING. DR. GROSS NOTIFIED OF RECOMMENDATION AND ORDER FACILITY CHANGE GIVEN TO KATY.

## 2011-03-04 ENCOUNTER — Ambulatory Visit (HOSPITAL_COMMUNITY)
Admission: RE | Admit: 2011-03-04 | Discharge: 2011-03-04 | Disposition: A | Discharge status: Home / Self Care | Payer: Medicare Other | Source: Ambulatory Visit | Attending: Surgery | Admitting: Surgery

## 2011-03-04 DIAGNOSIS — M810 Age-related osteoporosis without current pathological fracture: Secondary | ICD-10-CM | POA: Insufficient documentation

## 2011-03-04 DIAGNOSIS — F17200 Nicotine dependence, unspecified, uncomplicated: Secondary | ICD-10-CM | POA: Insufficient documentation

## 2011-03-04 DIAGNOSIS — K4020 Bilateral inguinal hernia, without obstruction or gangrene, not specified as recurrent: Secondary | ICD-10-CM | POA: Insufficient documentation

## 2011-03-04 DIAGNOSIS — K219 Gastro-esophageal reflux disease without esophagitis: Secondary | ICD-10-CM | POA: Insufficient documentation

## 2011-03-04 DIAGNOSIS — D176 Benign lipomatous neoplasm of spermatic cord: Secondary | ICD-10-CM | POA: Insufficient documentation

## 2011-03-04 DIAGNOSIS — R29898 Other symptoms and signs involving the musculoskeletal system: Secondary | ICD-10-CM | POA: Insufficient documentation

## 2011-03-04 DIAGNOSIS — I10 Essential (primary) hypertension: Secondary | ICD-10-CM | POA: Insufficient documentation

## 2011-03-04 DIAGNOSIS — I69928 Other speech and language deficits following unspecified cerebrovascular disease: Secondary | ICD-10-CM | POA: Insufficient documentation

## 2011-03-04 DIAGNOSIS — I69998 Other sequelae following unspecified cerebrovascular disease: Secondary | ICD-10-CM | POA: Insufficient documentation

## 2011-03-04 DIAGNOSIS — R7309 Other abnormal glucose: Secondary | ICD-10-CM | POA: Insufficient documentation

## 2011-03-04 HISTORY — PX: HERNIA REPAIR: SHX51

## 2011-03-04 LAB — CBC
HCT: 42.1 % (ref 39.0–52.0)
Hemoglobin: 13.4 g/dL (ref 13.0–17.0)
MCV: 91.7 fL (ref 78.0–100.0)
RDW: 15.3 % (ref 11.5–15.5)
WBC: 8.3 10*3/uL (ref 4.0–10.5)

## 2011-03-04 LAB — GLUCOSE, CAPILLARY
Glucose-Capillary: 70 mg/dL (ref 70–99)
Glucose-Capillary: 154 mg/dL — ABNORMAL HIGH (ref 70–99)

## 2011-03-04 LAB — BASIC METABOLIC PANEL
BUN: 21 mg/dL (ref 6–23)
CO2: 26 mEq/L (ref 19–32)
Chloride: 103 mEq/L (ref 96–112)
Creatinine, Ser: 1.22 mg/dL (ref 0.50–1.35)
Glucose, Bld: 77 mg/dL (ref 70–99)
Potassium: 3.9 mEq/L (ref 3.5–5.1)

## 2011-03-08 NOTE — Op Note (Signed)
NAMESHANNAN, GARFINKEL NO.:  [id number]  MEDICAL RECORD NO.:  [id number]  LOCATION:                                 FACILITY:  PHYSICIAN:  Ardeth Sportsman, MD     DATE OF BIRTH:  Sep 09, 1932  DATE OF PROCEDURE: DATE OF DISCHARGE:                              OPERATIVE REPORT   PRIMARY CARE PHYSICIAN:  Holley Bouche, M.D., [facility].  UROLOGIST:  Lindaann Slough, M.D.  SURGEON:  Ardeth Sportsman, MD  ASSISTANT:  RN.  PREOPERATIVE DIAGNOSES: 1. Right scrotal inguinal hernia. 2. Question of left inguinal hernia.  POSTOPERATIVE DIAGNOSES: 1. Right scrotal indirect inguinal hernia. 2. Left indirect inguinal hernia.  ANESTHESIA: 1. General. 2. Bilateral ilioinguinal/genitofemoral/spermatic cord nerve blocks.  SPECIMENS:  Right scrotal hernia sac (not sent).  ESTIMATED BLOOD LOSS:  550 mL.  COMPLICATIONS:  None major.  INDICATIONS:  Mr. Capo is a 77 year old male with stable health issues who has had a right groin swelling over the past 4 years.  It has increased in size and become more uncomfortable.  He began to have difficulty with mobility.  He was sent to me for surgical evaluation.  I felt he had a definite right inguinal hernia going down the scrotum and a probable small left indirect inguinal hernia as well.  Pathophysiology of inguinal herniation with its natural history, risks of incarceration, and strangulation were discussed.  Options discussed. Recommendation made for laparoscopic, possible open right and possible left inguinal hernia repairs.  He had a medical clearance by Dr. Tiburcio Pea preoperatively.  Risks, benefits, and alternatives were discussed. Questions answered.  He agreed to proceed.  OPERATIVE FINDINGS:  He had a large indirect inguinal hernia, right scrotal hernia sac.  No evidence of any direct femoral or obturator defects on the right side.  On the left, he had a small indirect inguinal hernia.  Again, no  direct femoral or obturator hernias.  DESCRIPTION OF PROCEDURE:  Informed consent was confirmed.  The patient voided prior to coming to operating room.  He underwent general anesthesia without any difficulty.  He received IV cefazolin prior to incision.  He had sequential compression devices active during the entire case.  His abdomen and mons pubis were clipped, prepped and draped in sterile fashion.  Surgical time-out confirmed our plan.  I placed a 12 mm port in the preperitoneal plane using a modified Hassan cutdown technique.  I had underneath the left rectus muscle since the right side was a dominant hernia.  I induced carbon dioxide insufflation.  I used camera to help free peritoneum off bilateral lower quadrants.  I created enough working space such I could place 5 mm ports in the right, left, and mid abdomen into the preperitoneal plane.  I turned attention toward the right side.  I freed peritoneum off the right flank, and did some sharp dissection.  I did have entry into the peritoneum in the mid abdomen, but I controlled that with a 3-0 Vicryl stitch using laparoscopic intracorporeal suturing.  I did further dissection to free the peritoneum down up to the pubic rim.  I saw a large swath of peritoneum going into a dilated internal ring  consistent with a large indirect inguinal hernia.  Over time, I was able to free the peritoneal hernia sac off the spermatic cord vessels and vas deferens.  I took some time to reduce that down.  He had a few moderate- sized cord lipomas that I morcellated and removed.  Eventually, peeled off the hernia sac proximally and reduced it completely down.  I did have to open up the hernia sac just to make sure nothing was incarcerated within it and it was not.  I had reduced that down.  I trimmed excess hernia sac and removed that out the umbilical port after switching to a 5 mm camera briefly.  I then switched back to a 10 mm camera.  I closed  that defect using a 3-0 Vicryl stitch using laparoscopic running intracorporeal Vicryl stitch.  I created a window between the anterolateral bladder and the right pelvis on the right side.  He did have some fibrous adhesions in the mid line.  I freed those off sharply to help get these to the pubic rim.  I did dissection on the left side in a similar mirror-image fashion.  He had a smaller hernia sac on the left side.  It was much easier to reduce.  Interestingly, he had 2 moderate-sized cord lipoma on left side that were larger.  I reduced and morcellated those as well.  I chose 15 x 15 cm ultra lightweight polypropylene mesh (UltraPro).  I cut a slit in each mesh such that a 6 x 6 cm flap rested in the true pelvis between the true pelvic sidewall and the lateral bladder.  They laid down in a overlapping diamonds with the medial corners overlapping over the midline near the pubic rim.  This provided at least 2 inches circumferential coverage around the internal ring indirect hernias. Direct space in the femoral and obturator foramina were covered with this as well.  I held the mesh in place.  I did some irrigation and returned.  There was some bleeding when I cut the hernia sac, but otherwise hemostasis was excellent.  I held the mesh in place and evacuated carbon dioxide and removed the ports.  I did make an opening at the base of the umbilical stalk and released some of the carbon dioxide.  I evacuated the peritoneum as well as the right scrotal sac.  I closed the fascia using 0 Vicryl stitch transversely.  I closed the skin using 4-0 Monocryl stitch.  Sterile dressing was applied.  The patient was being extubated to go to recovery room.  If he meets discharge goals, we will let him go home later day, but more likely he might have to stay overnight.  Of note, I did discuss postop care with the patient in the office and discussed it with him prior to surgery.  I have written  instructions.  I will discuss with the family again.     Ardeth Sportsman, MD     SCG/MEDQ  D:  03/04/2011  T:  03/05/2011  Job:  [id number]  cc:   Holley Bouche, M.D. Fax: [phone]  Lindaann Slough, M.D. Fax: [phone]  Electronically Signed by Karie Soda MD on 03/08/2011 08:57:24 PM

## 2011-03-23 ENCOUNTER — Encounter (INDEPENDENT_AMBULATORY_CARE_PROVIDER_SITE_OTHER): Payer: Self-pay | Admitting: Surgery

## 2011-03-23 ENCOUNTER — Ambulatory Visit (INDEPENDENT_AMBULATORY_CARE_PROVIDER_SITE_OTHER): Payer: Medicare Other | Admitting: Surgery

## 2011-03-23 VITALS — BP 138/82 | HR 60 | Temp 98.2°F | Resp 20 | Ht 72.0 in | Wt 186.2 lb

## 2011-03-23 DIAGNOSIS — K4020 Bilateral inguinal hernia, without obstruction or gangrene, not specified as recurrent: Secondary | ICD-10-CM

## 2011-03-23 NOTE — Progress Notes (Signed)
Subjective:     Patient ID: [name], male   DOB: [birth year], 77 y.o.   MRN: [id number]  HPI  Patient Care Team: Lolita Patella as PCP - General (Family Medicine) Lindaann Slough, MD as Consulting Physician (Urology)  This patient is a 77 y.o.male who presents today for surgical evaluation.   Procedure: Laparoscopic bilateral inguinal hernia repair 03/04/2011  Patient comes today feeling well. Was off pain medications and a few days. Urinating fine. No bruising. Regular bowel movements. He walks with a walker.  Past Medical History  Diagnosis Date  . Right inguinal hernia october 2012  . Diabetes mellitus   . Hypertension   . Glaucoma   . Colon polyps   . Osteoporosis     spine  . GERD (gastroesophageal reflux disease)   . Hand eczema   . Hyperlipidemia   . Stroke   . Aphasia   . Depression   . BPH (benign prostatic hyperplasia)   . Bladder stones 10/2010    Past Surgical History  Procedure Date  . Transurethral resection of prostate June 2012    Removal of bladder stone  . Hip arthroplasty April 2012    Dr. Shelle Iron  . Hernia repair 03/04/11    lap bilateral inguinal hernia repair     History   Social History  . Marital Status: Married    Spouse Name: N/A    Number of Children: N/A  . Years of Education: N/A   Occupational History  . Not on file.   Social History Main Topics  . Smoking status: Current Everyday Smoker    Types: Pipe  . Smokeless tobacco: Never Used  . Alcohol Use: No  . Drug Use: No  . Sexually Active:    Other Topics Concern  . Not on file   Social History Narrative  . No narrative on file    History reviewed. No pertinent family history.  Current outpatient prescriptions:HYDROmorphone (DILAUDID) 2 MG tablet, Take 2 mg by mouth every 4 (four) hours as needed.  , Disp: , Rfl: ;  LUMIGAN 0.03 % ophthalmic solution, , Disp: , Rfl: ;  NIFEdipine (PROCARDIA XL/ADALAT-CC) 60 MG 24 hr tablet, , Disp: , Rfl: ;  PARoxetine  (PAXIL) 20 MG tablet, , Disp: , Rfl: ;  polyethylene glycol (MIRALAX / GLYCOLAX) packet, Take 17 g by mouth daily.  , Disp: , Rfl:  ranitidine (ZANTAC) 150 MG tablet, , Disp: , Rfl: ;  senna (SENOKOT) 8.6 MG TABS, Take 2 tablets by mouth daily.  , Disp: , Rfl: ;  simvastatin (ZOCOR) 10 MG tablet, , Disp: , Rfl: ;  zolpidem (AMBIEN) 10 MG tablet, Take 10 mg by mouth at bedtime as needed.  , Disp: , Rfl:   No Known Allergies  BP 138/82  Pulse 60  Temp(Src) 98.2 F (36.8 C) (Temporal)  Resp 20  Ht 6' (1.829 m)  Wt 186 lb 4 oz (84.482 kg)  BMI 25.26 kg/m2     Review of Systems  Constitutional: Negative for fever, chills and diaphoresis.  HENT: Positive for hearing loss. Negative for sore throat, trouble swallowing and neck pain.   Eyes: Negative for photophobia and visual disturbance.  Respiratory: Negative for choking and shortness of breath.   Cardiovascular: Negative for chest pain and palpitations.  Gastrointestinal: Negative for nausea, vomiting, diarrhea, constipation, abdominal distention, anal bleeding and rectal pain.  Genitourinary: Negative for dysuria, urgency, discharge, penile swelling, scrotal swelling, difficulty urinating, penile pain and testicular pain.  Musculoskeletal:  Negative for myalgias, arthralgias and gait problem.  Skin: Negative for color change and rash.  Neurological: Negative for dizziness, speech difficulty, weakness and numbness.  Hematological: Negative for adenopathy.  Psychiatric/Behavioral: Negative for hallucinations, confusion and agitation.       Objective:   Physical Exam  Constitutional: He is oriented to person, place, and time. He appears well-developed and well-nourished. No distress.  HENT:  Head: Normocephalic.  Mouth/Throat: Oropharynx is clear and moist. No oropharyngeal exudate.  Eyes: Conjunctivae and EOM are normal. Pupils are equal, round, and reactive to light. No scleral icterus.  Neck: Normal range of motion. No tracheal  deviation present.  Cardiovascular: Normal rate and intact distal pulses.   Pulmonary/Chest: Effort normal. No respiratory distress.  Abdominal: Soft. He exhibits no distension. There is no tenderness. Hernia confirmed negative in the right inguinal area and confirmed negative in the left inguinal area.       Incisions clean with normal healing ridges.  No hernias  Genitourinary: Penis normal.       Mild scrotal fullness.  Spermatic cords mildly thickened c/w post-op healing  Musculoskeletal: Normal range of motion. He exhibits no tenderness.  Neurological: He is alert and oriented to person, place, and time. No cranial nerve deficit. He exhibits normal muscle tone. Coordination normal.  Skin: Skin is warm and dry. No rash noted. He is not diaphoretic.  Psychiatric: He has a normal mood and affect. His behavior is normal.       Assessment:     2.5 weeks s/p lap BIH repair, recovering well    Plan:     Increase activity as tolerated.  Do not push through pain.  Advanced on diet as tolerated. Bowel regimen to avoid problems.  Return to clinic p.r.n. The patient expressed understanding and appreciation

## 2013-02-06 ENCOUNTER — Other Ambulatory Visit: Payer: Self-pay | Admitting: Family Medicine

## 2013-02-06 ENCOUNTER — Ambulatory Visit
Admission: RE | Admit: 2013-02-06 | Discharge: 2013-02-06 | Disposition: A | Discharge status: Home / Self Care | Payer: Medicare Other | Source: Ambulatory Visit | Attending: Family Medicine | Admitting: Family Medicine

## 2013-02-06 DIAGNOSIS — M542 Cervicalgia: Secondary | ICD-10-CM

## 2013-02-07 ENCOUNTER — Inpatient Hospital Stay: Admission: RE | Admit: 2013-02-07 | Payer: Medicare Other | Source: Ambulatory Visit

## 2013-02-07 ENCOUNTER — Other Ambulatory Visit: Payer: Self-pay | Admitting: Family Medicine

## 2013-02-07 DIAGNOSIS — R9389 Abnormal findings on diagnostic imaging of other specified body structures: Secondary | ICD-10-CM

## 2013-02-08 ENCOUNTER — Ambulatory Visit
Admission: RE | Admit: 2013-02-08 | Discharge: 2013-02-08 | Disposition: A | Discharge status: Home / Self Care | Payer: Medicare Other | Source: Ambulatory Visit | Attending: Family Medicine | Admitting: Family Medicine

## 2013-02-08 DIAGNOSIS — R9389 Abnormal findings on diagnostic imaging of other specified body structures: Secondary | ICD-10-CM

## 2013-02-08 MED ORDER — IOHEXOL 300 MG/ML  SOLN
75.0000 mL | Freq: Once | INTRAMUSCULAR | Status: AC | PRN
  Administered 2013-02-08: 75 mL via INTRAVENOUS

## 2013-02-11 ENCOUNTER — Other Ambulatory Visit (HOSPITAL_COMMUNITY): Payer: Self-pay | Admitting: Family Medicine

## 2013-02-11 DIAGNOSIS — R222 Localized swelling, mass and lump, trunk: Secondary | ICD-10-CM

## 2013-02-22 ENCOUNTER — Encounter (HOSPITAL_COMMUNITY)
Admission: RE | Admit: 2013-02-22 | Discharge: 2013-02-22 | Disposition: A | Discharge status: Home / Self Care | Payer: Medicare Other | Source: Ambulatory Visit | Attending: Family Medicine | Admitting: Family Medicine

## 2013-02-22 ENCOUNTER — Encounter (HOSPITAL_COMMUNITY): Payer: Self-pay

## 2013-02-22 DIAGNOSIS — R222 Localized swelling, mass and lump, trunk: Secondary | ICD-10-CM | POA: Insufficient documentation

## 2013-02-22 DIAGNOSIS — J9819 Other pulmonary collapse: Secondary | ICD-10-CM | POA: Insufficient documentation

## 2013-02-22 DIAGNOSIS — M799 Soft tissue disorder, unspecified: Secondary | ICD-10-CM | POA: Insufficient documentation

## 2013-02-22 MED ORDER — FLUDEOXYGLUCOSE F - 18 (FDG) INJECTION
17.8000 | Freq: Once | INTRAVENOUS | Status: AC | PRN
  Administered 2013-02-22: 17.8 via INTRAVENOUS

## 2013-02-27 ENCOUNTER — Other Ambulatory Visit: Payer: Self-pay | Admitting: Family Medicine

## 2013-02-27 DIAGNOSIS — R222 Localized swelling, mass and lump, trunk: Secondary | ICD-10-CM

## 2013-03-01 ENCOUNTER — Other Ambulatory Visit: Payer: Self-pay | Admitting: Radiology

## 2013-03-04 ENCOUNTER — Encounter (HOSPITAL_COMMUNITY): Payer: Self-pay

## 2013-03-04 ENCOUNTER — Ambulatory Visit (HOSPITAL_COMMUNITY)
Admission: RE | Admit: 2013-03-04 | Discharge: 2013-03-04 | Disposition: A | Discharge status: Home / Self Care | Payer: Medicare Other | Source: Ambulatory Visit | Attending: Family Medicine | Admitting: Family Medicine

## 2013-03-04 DIAGNOSIS — R222 Localized swelling, mass and lump, trunk: Secondary | ICD-10-CM | POA: Insufficient documentation

## 2013-03-04 DIAGNOSIS — C3490 Malignant neoplasm of unspecified part of unspecified bronchus or lung: Secondary | ICD-10-CM

## 2013-03-04 HISTORY — PX: LUNG BIOPSY: SHX232

## 2013-03-04 HISTORY — DX: Malignant neoplasm of unspecified part of unspecified bronchus or lung: C34.90

## 2013-03-04 LAB — CBC
Hemoglobin: 12.6 g/dL — ABNORMAL LOW (ref 13.0–17.0)
MCH: 29 pg (ref 26.0–34.0)
MCHC: 32 g/dL (ref 30.0–36.0)
Platelets: 355 10*3/uL (ref 150–400)
RBC: 4.35 MIL/uL (ref 4.22–5.81)
WBC: 12.9 10*3/uL — ABNORMAL HIGH (ref 4.0–10.5)

## 2013-03-04 LAB — APTT: aPTT: 35 seconds (ref 24–37)

## 2013-03-04 LAB — PROTIME-INR
INR: 1.08 (ref 0.00–1.49)
Prothrombin Time: 13.8 seconds (ref 11.6–15.2)

## 2013-03-04 MED ORDER — MIDAZOLAM HCL 2 MG/2ML IJ SOLN
INTRAMUSCULAR | Status: AC | PRN
  Administered 2013-03-04: 1 mg via INTRAVENOUS
  Administered 2013-03-04: 0.5 mg via INTRAVENOUS

## 2013-03-04 MED ORDER — FENTANYL CITRATE 0.05 MG/ML IJ SOLN
INTRAMUSCULAR | Status: AC | PRN
  Administered 2013-03-04 (×2): 25 ug via INTRAVENOUS

## 2013-03-04 MED ORDER — MIDAZOLAM HCL 2 MG/2ML IJ SOLN
INTRAMUSCULAR | Status: AC
  Filled 2013-03-04: qty 4

## 2013-03-04 MED ORDER — LIDOCAINE HCL 1 % IJ SOLN
INTRAMUSCULAR | Status: AC
  Filled 2013-03-04: qty 10

## 2013-03-04 MED ORDER — FENTANYL CITRATE 0.05 MG/ML IJ SOLN
INTRAMUSCULAR | Status: AC
  Filled 2013-03-04: qty 4

## 2013-03-04 MED ORDER — SODIUM CHLORIDE 0.9 % IV SOLN
INTRAVENOUS | Status: AC | PRN
  Administered 2013-03-04: 75 mL/h via INTRAVENOUS

## 2013-03-04 MED ORDER — SODIUM CHLORIDE 0.9 % IV SOLN
Freq: Once | INTRAVENOUS | Status: AC
  Administered 2013-03-04: 08:00:00 via INTRAVENOUS

## 2013-03-04 NOTE — H&P (Signed)
[name] is an 79 y.o. male.   Chief Complaint: pt developed neck pain and cough early Oct. Evaluation revealed mediastinal mass on CXR and CT +PET 02/22/2013 Scheduled now for mediastinal mass biopsy +smoker HPI: DM; smoker; glaucoma; HTN; GERD; HLD; BPH  Past Medical History  Diagnosis Date  . Right inguinal hernia october 2012  . Diabetes mellitus   . Hypertension   . Glaucoma   . Colon polyps   . Osteoporosis     spine  . GERD (gastroesophageal reflux disease)   . Hand eczema   . Hyperlipidemia   . Stroke   . Aphasia   . Depression   . BPH (benign prostatic hyperplasia)   . Bladder stones 10/2010    Past Surgical History  Procedure Laterality Date  . Transurethral resection of prostate  June 2012    Removal of bladder stone  . Hip arthroplasty  April 2012    Dr. Shelle Iron  . Hernia repair  03/04/11    lap bilateral inguinal hernia repair     History reviewed. No pertinent family history. Social History:  reports that he has been smoking Pipe.  He has never used smokeless tobacco. He reports that he does not drink alcohol or use illicit drugs.  Allergies: No Known Allergies   (Not in a hospital admission)  Results for orders placed during the hospital encounter of 03/04/13 (from the past 48 hour(s))  APTT     Status: None   Collection Time    03/04/13  7:58 AM      Result Value Range   aPTT 35  24 - 37 seconds  CBC     Status: Abnormal   Collection Time    03/04/13  7:58 AM      Result Value Range   WBC 12.9 (*) 4.0 - 10.5 K/uL   RBC 4.35  4.22 - 5.81 MIL/uL   Hemoglobin 12.6 (*) 13.0 - 17.0 g/dL   HCT 45.4  09.8 - 11.9 %   MCV 90.6  78.0 - 100.0 fL   MCH 29.0  26.0 - 34.0 pg   MCHC 32.0  30.0 - 36.0 g/dL   RDW 14.7  82.9 - 56.2 %   Platelets 355  150 - 400 K/uL  PROTIME-INR     Status: None   Collection Time    03/04/13  7:58 AM      Result Value Range   Prothrombin Time 13.8  11.6 - 15.2 seconds   INR 1.08  0.00 - 1.49   No results  found.  Review of Systems  Constitutional: Negative for fever, chills and weight loss.  HENT: Positive for hearing loss.   Respiratory: Positive for cough.   Cardiovascular: Negative for chest pain.  Gastrointestinal: Negative for nausea, vomiting and abdominal pain.  Musculoskeletal: Positive for neck pain.  Neurological: Positive for weakness. Negative for dizziness and headaches.  Psychiatric/Behavioral: Positive for substance abuse.       Smoker    Blood pressure 133/66, pulse 96, temperature 98 F (36.7 C), temperature source Oral, resp. rate 18, height 6' (1.829 m), weight 165 lb (74.844 kg), SpO2 98.00%. Physical Exam  Constitutional: He is oriented to person, place, and time. He appears well-developed.  thin  Cardiovascular: Normal rate, regular rhythm and normal heart sounds.   No murmur heard. Respiratory: Effort normal and breath sounds normal. He has no wheezes.  GI: Soft. Bowel sounds are normal. There is no tenderness.  Musculoskeletal: Normal range of motion.  Neurological: He is alert and oriented to person, place, and time.  Minimally confused  Skin: Skin is warm and dry.  Psychiatric: He has a normal mood and affect. His behavior is normal. Judgment and thought content normal.  Signed by dtr- POA     Assessment/Plan Pt with cough and neck pain Work up reveals mediastinal mass on CXR and CT; +PET Pt and dtr aware of procedure benefits and risks and agreeable to proceed Consent signed and in chart   [place] Kappes A 03/04/2013, 8:33 AM

## 2013-03-04 NOTE — Procedures (Signed)
Successful mediastinal mass 18g core bx No comp Stable Path pending

## 2013-03-04 NOTE — ED Notes (Signed)
O2 d/c'd 

## 2013-03-04 NOTE — H&P (Signed)
Agree with CT guided bx mediastinal mass

## 2013-03-08 ENCOUNTER — Telehealth: Payer: Self-pay | Admitting: *Deleted

## 2013-03-08 NOTE — Telephone Encounter (Signed)
Spoke with care giver regarding appt for [facility] 03/14/13.  She verbalized understanding of time and place of appt

## 2013-03-14 ENCOUNTER — Ambulatory Visit
Admission: RE | Admit: 2013-03-14 | Discharge: 2013-03-14 | Disposition: A | Discharge status: Home / Self Care | Payer: Medicare Other | Source: Ambulatory Visit | Attending: Radiation Oncology | Admitting: Radiation Oncology

## 2013-03-14 ENCOUNTER — Encounter: Payer: Self-pay | Admitting: *Deleted

## 2013-03-14 ENCOUNTER — Ambulatory Visit (HOSPITAL_BASED_OUTPATIENT_CLINIC_OR_DEPARTMENT_OTHER): Payer: Medicare Other | Admitting: Internal Medicine

## 2013-03-14 ENCOUNTER — Encounter (INDEPENDENT_AMBULATORY_CARE_PROVIDER_SITE_OTHER): Payer: Self-pay

## 2013-03-14 ENCOUNTER — Encounter: Payer: Self-pay | Admitting: Internal Medicine

## 2013-03-14 ENCOUNTER — Ambulatory Visit: Payer: Medicare Other | Attending: Internal Medicine | Admitting: Physical Therapy

## 2013-03-14 DIAGNOSIS — IMO0001 Reserved for inherently not codable concepts without codable children: Secondary | ICD-10-CM | POA: Insufficient documentation

## 2013-03-14 DIAGNOSIS — R5381 Other malaise: Secondary | ICD-10-CM | POA: Insufficient documentation

## 2013-03-14 DIAGNOSIS — R059 Cough, unspecified: Secondary | ICD-10-CM

## 2013-03-14 DIAGNOSIS — C3490 Malignant neoplasm of unspecified part of unspecified bronchus or lung: Secondary | ICD-10-CM | POA: Insufficient documentation

## 2013-03-14 DIAGNOSIS — R0609 Other forms of dyspnea: Secondary | ICD-10-CM

## 2013-03-14 DIAGNOSIS — R293 Abnormal posture: Secondary | ICD-10-CM | POA: Insufficient documentation

## 2013-03-14 DIAGNOSIS — R0989 Other specified symptoms and signs involving the circulatory and respiratory systems: Secondary | ICD-10-CM

## 2013-03-14 DIAGNOSIS — R269 Unspecified abnormalities of gait and mobility: Secondary | ICD-10-CM | POA: Insufficient documentation

## 2013-03-14 DIAGNOSIS — R05 Cough: Secondary | ICD-10-CM

## 2013-03-14 DIAGNOSIS — Z87891 Personal history of nicotine dependence: Secondary | ICD-10-CM

## 2013-03-16 DIAGNOSIS — C3490 Malignant neoplasm of unspecified part of unspecified bronchus or lung: Secondary | ICD-10-CM | POA: Insufficient documentation

## 2013-03-16 NOTE — Patient Instructions (Signed)
We discussed the treatment options including concurrent chemoradiation versus palliative radiotherapy versus palliative care and hospice referral. You and your family are more interested in some palliative radiotherapy without chemotherapy. Follow up with Dr. Mitzi Hansen as planned for radiation treatment.

## 2013-03-16 NOTE — Progress Notes (Signed)
[facility] Telephone:([phone]) [phone]   [phone] Multidisciplinary thoracic oncology clinic (MTOC)  CONSULT NOTE  REFERRING PHYSICIAN: Dr. Johny Blamer  REASON FOR CONSULTATION:   79 years old white male recently diagnosed with lung cancer.  HPI [name] is a 79 y.o. male with past medical history significant for multiple medical problems including diabetes mellitus, hypertension, osteoporosis, glaucoma, GERD, dyslipidemia, history of stroke twice in 1998/09/23 and 09-22-00, depression, benign prostatic hypertrophy status post TURP as well as history of glaucoma. The patient also has a long history of smoking but quit in 09/22/00. He has been complaining of pain in the neck area in addition to a cough for a period of 3 months. 2 was initially thought to be secondary to arthritis and he was seen by Dr. Tiburcio Pea, his primary care physician. Chest x-ray was performed on 02/06/2013 and it showed stable cardiomegaly and central pulmonary vascular congestion was elevated left hemidiaphragm. This was followed by CT scan of the chest with contrast on 02/08/2013 and it showed abnormal mediastinal density which seemed related to the left hilum on this CT is demonstrated to be a lobulated and infiltrative  appearing anterior mediastinal soft tissue mass with heterogeneous enhancement encompassing 82 x 62 x 68 mm (transverse by AP by CC). The epicenter is in the left prevascular space at the level of the main pulmonary artery. The mass is inseparable from the epicardium and there is a small left pericardial effusion measuring about 8 mm  in thickness. Paucity of other mediastinal nodes (small but conspicuous left AP window node measuring 8 mm in short axis). No bonafide lung mass, but there is a spiculated margin of the mediastinal mass with the adjacent the left upper lung with tiny adjacent nodularity in the lung. The left lower lobe except for the superior segment is collapsed/drowned.  The left lower lobe airway is abnormal, and although the appearance favors obstructed secretions over and endobronchial mass, bronchoscopic evaluation is recommended. On 02/22/2013 the patient had a PET scan performed and it showed Anterior mediastinal mass exhibits intense FDG uptake compatible with malignancy. Mucous debris and postobstructive atelectasis and consolidation involves the left lower lobe. If this does not improve I would consider further evaluation with bronchoscopy to assess for  underlying endobronchial lesion. Soft tissue lesion within the right parotid gland exhibits  malignant range FDG uptake. This is indeterminate. Cannot rule out primary or metastatic lesion. CT-guided core biopsy of the anterior mediastinal mass was performed on 03/04/2013 and the final pathology (Accession: [id number]) was consistent with squamous cell carcinoma.  Dr. Tiburcio Pea kindly referred the patient to me today for evaluation and discussion of his treatment options When seen today the patient continues to complain of the neck pain and he is currently on Tylenol #3 and tramadol. He also has poor appetite and lost several pounds recently. He continues to complain of fatigue as well as shortness breath at baseline and increased with exertion and mild cough but no hemoptysis. He has no visual changes or headache. He denied having any significant nausea or vomiting or change in his bowel movement. His family history significant for a father with colon cancer, mother with myocardial infarction and maternal grandmother with stomach cancer. The patient is a widow and his wife died from lung cancer in 2000/09/22. He was accompanied today by his daughter [name] and his son [name]. He has a long history of smoking for around 60 years and quit in 2010-09-23. He drinks alcohol occasionally.  He used to work as a Education administrator.  HPI  Past Medical History  Diagnosis Date  . Right inguinal hernia october 2012  . Diabetes mellitus   .  Hypertension   . Glaucoma   . Colon polyps   . Osteoporosis     spine  . GERD (gastroesophageal reflux disease)   . Hand eczema   . Hyperlipidemia   . Stroke   . Aphasia   . Depression   . BPH (benign prostatic hyperplasia)   . Bladder stones 10/2010    Past Surgical History  Procedure Laterality Date  . Transurethral resection of prostate  June 2012    Removal of bladder stone  . Hip arthroplasty  April 2012    Dr. Shelle Iron  . Hernia repair  03/04/11    lap bilateral inguinal hernia repair     History reviewed. No pertinent family history.  Social History History  Substance Use Topics  . Smoking status: Current Every Day Smoker    Types: Pipe  . Smokeless tobacco: Never Used  . Alcohol Use: No    No Known Allergies  Current Outpatient Prescriptions  Medication Sig Dispense Refill  . acetaminophen-codeine (TYLENOL #3) 300-30 MG per tablet Take 1 tablet by mouth every 4 (four) hours as needed for pain.      Marland Kitchen NIFEdipine (PROCARDIA XL/ADALAT-CC) 60 MG 24 hr tablet Take 60 mg by mouth daily.       Marland Kitchen PARoxetine (PAXIL) 40 MG tablet Take 40 mg by mouth daily.      . ranitidine (ZANTAC) 150 MG tablet Take 150 mg by mouth 2 (two) times daily.       Marland Kitchen senna (SENOKOT) 8.6 MG TABS Take 2 tablets by mouth daily.        . Sennosides (EX-LAX PO) Take 1 tablet by mouth daily.      . simvastatin (ZOCOR) 10 MG tablet Take 10 mg by mouth daily.       . traMADol (ULTRAM) 50 MG tablet Take 50 mg by mouth every 6 (six) hours as needed for pain.       No current facility-administered medications for this visit.    Review of Systems  Constitutional: positive for anorexia, fatigue and weight loss Eyes: negative Ears, nose, mouth, throat, and face: negative Respiratory: positive for cough and dyspnea on exertion Cardiovascular: negative Gastrointestinal: negative Genitourinary:negative Integument/breast: negative Hematologic/lymphatic: negative Musculoskeletal:positive for muscle  weakness and neck pain Neurological: positive for weakness Behavioral/Psych: negative Endocrine: negative Allergic/Immunologic: negative  Physical Exam  ZOX:WRUEA, healthy, no distress and malnourished SKIN: skin color, texture, turgor are normal, no rashes or significant lesions HEAD: Normocephalic, No masses, lesions, tenderness or abnormalities EYES: normal, PERRLA EARS: External ears normal, Canals clear OROPHARYNX:no exudate, no erythema and lips, buccal mucosa, and tongue normal  NECK: supple, no adenopathy, no JVD LYMPH:  no palpable lymphadenopathy, no hepatosplenomegaly LUNGS: clear to auscultation , and palpation HEART: regular rate & rhythm, no murmurs and no gallops ABDOMEN:abdomen soft, non-tender, normal bowel sounds and no masses or organomegaly BACK: Back symmetric, no curvature., No CVA tenderness EXTREMITIES:no joint deformities, effusion, or inflammation, no edema, no skin discoloration, no clubbing  NEURO: alert & oriented x 3 with fluent speech, no focal motor/sensory deficits  PERFORMANCE STATUS: ECOG 2-3  LABORATORY DATA: Lab Results  Component Value Date   WBC 12.9* 03/04/2013   HGB 12.6* 03/04/2013   HCT 39.4 03/04/2013   MCV 90.6 03/04/2013   PLT 355 03/04/2013      Chemistry  Component Value Date/Time   NA 140 03/04/2011 1335   K 3.9 03/04/2011 1335   CL 103 03/04/2011 1335   CO2 26 03/04/2011 1335   BUN 21 03/04/2011 1335   CREATININE 1.22 03/04/2011 1335      Component Value Date/Time   CALCIUM 9.8 03/04/2011 1335   ALKPHOS 121* 09/10/2010 2248   AST 59* 09/10/2010 2248   ALT 70* 09/10/2010 2248   BILITOT 0.2* 09/10/2010 2248       RADIOGRAPHIC STUDIES: Nm Pet Image Initial (pi) Skull Base To Thigh  02/22/2013   CLINICAL DATA:  Initial treatment strategy for anterior mediastinal mass.  EXAM: NUCLEAR MEDICINE PET SKULL BASE TO THIGH  FASTING BLOOD GLUCOSE:  Value:  101 mg/dl  TECHNIQUE: 16.1 mCi W-96 FDG was injected intravenously. CT data was  obtained and used for attenuation correction and anatomic localization only. (This was not acquired as a diagnostic CT examination.) Additional exam technical data entered on technologist worksheet.  COMPARISON:  02/08/2013  FINDINGS: NECK  There is a soft tissue mass in the right parotid gland measuring 2.2 cm, image 14/ series 2. The SUV max associated with this nodule is equal to 4.5.  CHEST  Large anterior mediastinal mass is identified. This measures approximately 8.5 x 5.5 cm and has an SUV max equal to 20.7.  There is debris identified within the left lower lobe airway. Postobstructive consolidation and atelectasis is noted in the left lower lobe. No hypermetabolic pulmonary nodule or mass noted.  ABDOMEN/PELVIS  No abnormal hypermetabolic activity within the liver, pancreas, adrenal glands, or spleen. No hypermetabolic lymph nodes in the abdomen or pelvis. Infrarenal abdominal aortic aneurysm measures 3.2 cm, image 147/series 2.  SKELETON  No focal hypermetabolic activity to suggest skeletal metastasis. There is intense FDG uptake associated with the left hip arthroplasty device.  IMPRESSION: 1. Anterior mediastinal mass exhibits intense FDG uptake compatible with malignancy.  2. Mucous debris and postobstructive atelectasis and consolidation involves the left lower lobe. If this does not improve I would consider further evaluation with bronchoscopy to assess for underlying endobronchial lesion.  3. Soft tissue lesion within the right parotid gland exhibits malignant range FDG uptake. This is indeterminate. Cannot rule out primary or metastatic lesion.   Electronically Signed   By: Signa Kell M.D.   On: 02/22/2013 12:05   Ct Biopsy  03/04/2013   CLINICAL DATA:  Large anterior mediastinal mass concerning for lung cancer versus lymphoma  EXAM: CT-guided anterior mediastinal mass 18 gauge core biopsy  Date:  11/3/201411/06/2012 10:44 AM  Radiologist:  Judie Petit. Ruel Favors, MD  Guidance:  CT  MEDICATIONS AND  MEDICAL HISTORY: 1.5 mg Versed, 50 mcg fentanyl  ANESTHESIA/SEDATION: 10 min  CONTRAST:  None.  FLUOROSCOPY TIME:  None.  PROCEDURE: Informed consent was obtained from the patient following explanation of the procedure, risks, benefits and alternatives. The patient understands, agrees and consents for the procedure. All questions were addressed. A time out was performed.  Maximal barrier sterile technique utilized including caps, mask, sterile gowns, sterile gloves, large sterile drape, hand hygiene, and Betadine  Previous imaging reviewed. Patient positioned supine. Noncontrast localization CT performed. The large centrally necrotic mediastinal mass was localized. Under sterile conditions and local anesthesia, a 17 gauge 6.8 cm access needle was advanced from an anterior oblique approach to the lateral peripheral margin of the mass. Needle position confirmed with a CT. Biopsy location correlated with the PET scan for an area of hypermetabolic activity. From this location, 3 18 gauge  core biopsies obtained. Samples were placed in saline. No immediate complication. Patient tolerated the biopsy well.  COMPLICATIONS: No immediate  IMPRESSION: Successful CT-guided anterior mediastinal mass 18 gauge core biopsy   Electronically Signed   By: Ruel Favors M.D.   On: 03/04/2013 11:00    ASSESSMENT: This is a very pleasant 79 years old white male recently diagnosed with a stage IIIA (T3, N2, M0) non-small cell lung cancer, squamous cell carcinoma presented with large left lung mass inseparable from the epicardial and mediastinal lymphadenopathy.    PLAN: I had a lengthy discussion with the patient and his family today about his current disease stage, prognosis and treatment options. His performance status is very poor. I discussed with the patient several options for treatment of his condition including aggressive approach with concurrent chemoradiation with weekly carboplatin and paclitaxel versus radiotherapy alone  without chemotherapy versus palliative care and hospice referral. I discussed the adverse effect of the chemotherapy with the patient today including but not limited to alopecia, myelosuppression, nausea and vomiting, peripheral neuropathy, liver or renal dysfunction. The patient and his family are not interested in any aggressive measures at this point but they may consider some palliative radiotherapy to the left lung mass. The patient was seen by Dr. Mitzi Hansen earlier today for discussion of this option.  I discussed with the patient and his family his prognosis with and without treatment. I gave them the time to ask questions and I answered them completely to their satisfaction. The patient and his family have my contact information if they decide to consider concurrent chemotherapy with radiation, otherwise he will follow up with Dr. Mitzi Hansen and his primary care physician as scheduled. I would be happy to see him in the future as needed.  The patient voices understanding of current disease status and treatment options and is in agreement with the current care plan.  All questions were answered. The patient knows to call the clinic with any problems, questions or concerns. We can certainly see the patient much sooner if necessary.  Thank you so much for allowing me to participate in the care of [name]. I will continue to follow up the patient with you and assist in his care.  I spent 40 minutes counseling the patient face to face. The total time spent in the appointment was 60 minutes.  [name] K. 03/16/2013, 4:01 PM

## 2013-03-18 ENCOUNTER — Telehealth: Payer: Self-pay | Admitting: *Deleted

## 2013-03-18 NOTE — Telephone Encounter (Signed)
Called pt daughter today to check on pt status.  She stated he did not have a good day today.  She is concerned he will not do well with radiation therapy.  She spoke of Hospice.  I listened to her concerns.  I offered to reach out to Child psychotherapist for more information on Hospice.  She was relieved and would like to hear from Child psychotherapist.  I will notify Child psychotherapist and let her know she could call me it needed.  She was very appreciative and thankful.

## 2013-03-19 DIAGNOSIS — C3410 Malignant neoplasm of upper lobe, unspecified bronchus or lung: Secondary | ICD-10-CM | POA: Insufficient documentation

## 2013-03-19 NOTE — Progress Notes (Signed)
Radiation Oncology         [phone] ________________________________  Name: [name] MRN: [id number]  Date: 03/14/2013  DOB: [birth year]  AO:ZHYQM,VHQION ALEXANDER, MD  Johny Blamer, MD     REFERRING PHYSICIAN: Johny Blamer, MD   DIAGNOSIS: The encounter diagnosis was Lung cancer, left.   HISTORY OF PRESENT ILLNESS::[name] is a 79 y.o. male who is seen for an initial consultation visit. The patient has been complaining of some posterior neck pain/upper back pain. He underwent a chest x-ray on 02/06/2013 which showed stable cardiomegaly as well as some pulmonary vascular congestion. A CT scan of the chest was completed on 02/08/2013. This showed a large anterior mediastinal mass consistent with malignancy. This measured 8.2 cm in maximum dimension. The epicenter was in the left prevascular space at the level of the main pulmonary artery. No anterior lymphadenopathy present. A left AP window node measured 8 mm in short axis. Much of the left lower lobe is collapsed/mildly consolidated. The mediastinal mass had an indistinct spiculated margin with the left upper lobe/lingula. The patient then proceeded to undergo a PET scan. The anterior mediastinal mass exhibited intense hypermetabolic activity with a maximum SUV of 20.7. There is also a soft tissue mass in the right parotid gland measuring 2.2 cm.  The patient proceeded to undergo a CT-guided core biopsy of the anterior mediastinal mass. This returned positive for squamous cell carcinoma.  The patient currently describes some shortness of breath without any major change in this. He denies any cough or chest pain. The patient states that he has lost approximately 15 pounds.   PREVIOUS RADIATION THERAPY: No   PAST MEDICAL HISTORY:  has a past medical history of Right inguinal hernia (october 2012); Diabetes mellitus; Hypertension; Glaucoma; Colon polyps; Osteoporosis; GERD (gastroesophageal reflux disease); Hand  eczema; Hyperlipidemia; Stroke; Aphasia; Depression; BPH (benign prostatic hyperplasia); and Bladder stones (10/2010).     PAST SURGICAL HISTORY: Past Surgical History  Procedure Laterality Date  . Transurethral resection of prostate  June 2012    Removal of bladder stone  . Hip arthroplasty  April 2012    Dr. Shelle Iron  . Hernia repair  03/04/11    lap bilateral inguinal hernia repair      FAMILY HISTORY: family history is not on file.   SOCIAL HISTORY:  reports that he has been smoking Pipe.  He has never used smokeless tobacco. He reports that he does not drink alcohol or use illicit drugs.   ALLERGIES: Review of patient's allergies indicates no known allergies.   MEDICATIONS:  Current Outpatient Prescriptions  Medication Sig Dispense Refill  . acetaminophen-codeine (TYLENOL #3) 300-30 MG per tablet Take 1 tablet by mouth every 4 (four) hours as needed for pain.      Marland Kitchen NIFEdipine (PROCARDIA XL/ADALAT-CC) 60 MG 24 hr tablet Take 60 mg by mouth daily.       Marland Kitchen PARoxetine (PAXIL) 40 MG tablet Take 40 mg by mouth daily.      . ranitidine (ZANTAC) 150 MG tablet Take 150 mg by mouth 2 (two) times daily.       Marland Kitchen senna (SENOKOT) 8.6 MG TABS Take 2 tablets by mouth daily.        . Sennosides (EX-LAX PO) Take 1 tablet by mouth daily.      . simvastatin (ZOCOR) 10 MG tablet Take 10 mg by mouth daily.       . traMADol (ULTRAM) 50 MG tablet Take 50 mg by mouth every 6 (six)  hours as needed for pain.       No current facility-administered medications for this encounter.     REVIEW OF SYSTEMS:  A 15 point review of systems is documented in the electronic medical record. This was obtained by the nursing staff. However, I reviewed this with the patient to discuss relevant findings and make appropriate changes.  Pertinent items are noted in HPI.    PHYSICAL EXAM:  vitals were not taken for this visit.  General: Well-developed, in no acute distress; the patient is sitting in a wheelchair. He is  somewhat frail appearing HEENT: Normocephalic, atraumatic; oral cavity clear Neck: Supple without any lymphadenopathy Cardiovascular: Regular rate and rhythm Respiratory: Clear to auscultation bilaterally GI: Soft, nontender, normal bowel sounds Extremities: No edema present Neuro: No focal deficits     LABORATORY DATA:  Lab Results  Component Value Date   WBC 12.9* 03/04/2013   HGB 12.6* 03/04/2013   HCT 39.4 03/04/2013   MCV 90.6 03/04/2013   PLT 355 03/04/2013   Lab Results  Component Value Date   NA 140 03/04/2011   K 3.9 03/04/2011   CL 103 03/04/2011   CO2 26 03/04/2011   Lab Results  Component Value Date   ALT 70* 09/10/2010   AST 59* 09/10/2010   ALKPHOS 121* 09/10/2010   BILITOT 0.2* 09/10/2010      RADIOGRAPHY: Nm Pet Image Initial (pi) Skull Base To Thigh  02/22/2013   CLINICAL DATA:  Initial treatment strategy for anterior mediastinal mass.  EXAM: NUCLEAR MEDICINE PET SKULL BASE TO THIGH  FASTING BLOOD GLUCOSE:  Value:  101 mg/dl  TECHNIQUE: 16.1 mCi W-96 FDG was injected intravenously. CT data was obtained and used for attenuation correction and anatomic localization only. (This was not acquired as a diagnostic CT examination.) Additional exam technical data entered on technologist worksheet.  COMPARISON:  02/08/2013  FINDINGS: NECK  There is a soft tissue mass in the right parotid gland measuring 2.2 cm, image 14/ series 2. The SUV max associated with this nodule is equal to 4.5.  CHEST  Large anterior mediastinal mass is identified. This measures approximately 8.5 x 5.5 cm and has an SUV max equal to 20.7.  There is debris identified within the left lower lobe airway. Postobstructive consolidation and atelectasis is noted in the left lower lobe. No hypermetabolic pulmonary nodule or mass noted.  ABDOMEN/PELVIS  No abnormal hypermetabolic activity within the liver, pancreas, adrenal glands, or spleen. No hypermetabolic lymph nodes in the abdomen or pelvis. Infrarenal abdominal  aortic aneurysm measures 3.2 cm, image 147/series 2.  SKELETON  No focal hypermetabolic activity to suggest skeletal metastasis. There is intense FDG uptake associated with the left hip arthroplasty device.  IMPRESSION: 1. Anterior mediastinal mass exhibits intense FDG uptake compatible with malignancy.  2. Mucous debris and postobstructive atelectasis and consolidation involves the left lower lobe. If this does not improve I would consider further evaluation with bronchoscopy to assess for underlying endobronchial lesion.  3. Soft tissue lesion within the right parotid gland exhibits malignant range FDG uptake. This is indeterminate. Cannot rule out primary or metastatic lesion.   Electronically Signed   By: Signa Kell M.D.   On: 02/22/2013 12:05   Ct Biopsy  03/04/2013   CLINICAL DATA:  Large anterior mediastinal mass concerning for lung cancer versus lymphoma  EXAM: CT-guided anterior mediastinal mass 18 gauge core biopsy  Date:  11/3/201411/06/2012 10:44 AM  Radiologist:  Judie Petit. Ruel Favors, MD  Guidance:  CT  MEDICATIONS  AND MEDICAL HISTORY: 1.5 mg Versed, 50 mcg fentanyl  ANESTHESIA/SEDATION: 10 min  CONTRAST:  None.  FLUOROSCOPY TIME:  None.  PROCEDURE: Informed consent was obtained from the patient following explanation of the procedure, risks, benefits and alternatives. The patient understands, agrees and consents for the procedure. All questions were addressed. A time out was performed.  Maximal barrier sterile technique utilized including caps, mask, sterile gowns, sterile gloves, large sterile drape, hand hygiene, and Betadine  Previous imaging reviewed. Patient positioned supine. Noncontrast localization CT performed. The large centrally necrotic mediastinal mass was localized. Under sterile conditions and local anesthesia, a 17 gauge 6.8 cm access needle was advanced from an anterior oblique approach to the lateral peripheral margin of the mass. Needle position confirmed with a CT. Biopsy location  correlated with the PET scan for an area of hypermetabolic activity. From this location, 3 18 gauge core biopsies obtained. Samples were placed in saline. No immediate complication. Patient tolerated the biopsy well.  COMPLICATIONS: No immediate  IMPRESSION: Successful CT-guided anterior mediastinal mass 18 gauge core biopsy   Electronically Signed   By: Ruel Favors M.D.   On: 03/04/2013 11:00       IMPRESSION: The patient has a squamous cell carcinoma involving the anterior mediastinum with some apparent possible involvement of the left upper lobe. No other discreet pulmonary mass seen. The left lower lobe does have some consolidation without any evidence of malignancy which was clear on the PET scan. The patient's case was discussed in multidisciplinary thoracic conference. He was felt to be a potentially appropriate candidate for chemoradiotherapy. The patient is quite frail appearing however and he has discussed not proceeding with chemotherapy with Dr. Shirline Frees. A palliative or potentially definitive course of radiation treatment therefore would be a reasonable consideration therefore.  I discussed with the patient and his family the possible rationale of radiation treatment in this setting. We discussed potential improvement in local control. We also discussed the possible side effects and risks of treatment as well. All of their questions were answered. They are interested in potentially proceeding with radiation treatment.   PLAN: The patient will be scheduled for a simulation in the near future such that we can proceed with treatment planning. Title further clarify the goals of treatment to determine the length of treatment which would be most appropriate for this. I anticipate between a 2-5-1/2 week course of radiation treatment.    I spent 60 minutes face to face with the patient and more than 50% of that time was spent in counseling and/or coordination of care.     ________________________________   Radene Gunning, MD, PhD

## 2013-03-22 ENCOUNTER — Ambulatory Visit
Admission: RE | Admit: 2013-03-22 | Discharge: 2013-03-22 | Disposition: A | Discharge status: Home / Self Care | Payer: 59 | Source: Ambulatory Visit | Attending: Radiation Oncology | Admitting: Radiation Oncology

## 2013-03-22 ENCOUNTER — Ambulatory Visit
Admission: RE | Admit: 2013-03-22 | Discharge: 2013-03-22 | Disposition: A | Discharge status: Home / Self Care | Payer: Medicare Other | Source: Ambulatory Visit | Attending: Radiation Oncology | Admitting: Radiation Oncology

## 2013-03-22 DIAGNOSIS — C3490 Malignant neoplasm of unspecified part of unspecified bronchus or lung: Secondary | ICD-10-CM | POA: Insufficient documentation

## 2013-03-22 DIAGNOSIS — I639 Cerebral infarction, unspecified: Secondary | ICD-10-CM | POA: Insufficient documentation

## 2013-03-22 HISTORY — DX: Anemia, unspecified: D64.9

## 2013-03-22 HISTORY — DX: Malignant neoplasm of unspecified part of unspecified bronchus or lung: C34.90

## 2013-03-27 ENCOUNTER — Encounter (HOSPITAL_COMMUNITY): Payer: Self-pay | Admitting: Emergency Medicine

## 2013-03-27 ENCOUNTER — Emergency Department (HOSPITAL_COMMUNITY): Payer: Medicare Other

## 2013-03-27 ENCOUNTER — Inpatient Hospital Stay (HOSPITAL_COMMUNITY)
Admission: EM | Admit: 2013-03-27 | Discharge: 2013-05-02 | DRG: 208 | Disposition: E | Payer: Medicare Other | Attending: Internal Medicine | Admitting: Internal Medicine

## 2013-03-27 DIAGNOSIS — I959 Hypotension, unspecified: Secondary | ICD-10-CM | POA: Diagnosis present

## 2013-03-27 DIAGNOSIS — G9340 Encephalopathy, unspecified: Secondary | ICD-10-CM | POA: Diagnosis present

## 2013-03-27 DIAGNOSIS — F3289 Other specified depressive episodes: Secondary | ICD-10-CM | POA: Diagnosis present

## 2013-03-27 DIAGNOSIS — C3492 Malignant neoplasm of unspecified part of left bronchus or lung: Secondary | ICD-10-CM

## 2013-03-27 DIAGNOSIS — Z860100 Personal history of colon polyps, unspecified: Secondary | ICD-10-CM

## 2013-03-27 DIAGNOSIS — N400 Enlarged prostate without lower urinary tract symptoms: Secondary | ICD-10-CM

## 2013-03-27 DIAGNOSIS — E785 Hyperlipidemia, unspecified: Secondary | ICD-10-CM

## 2013-03-27 DIAGNOSIS — E1149 Type 2 diabetes mellitus with other diabetic neurological complication: Secondary | ICD-10-CM

## 2013-03-27 DIAGNOSIS — E860 Dehydration: Secondary | ICD-10-CM | POA: Diagnosis present

## 2013-03-27 DIAGNOSIS — D473 Essential (hemorrhagic) thrombocythemia: Secondary | ICD-10-CM | POA: Diagnosis not present

## 2013-03-27 DIAGNOSIS — Z8673 Personal history of transient ischemic attack (TIA), and cerebral infarction without residual deficits: Secondary | ICD-10-CM

## 2013-03-27 DIAGNOSIS — R031 Nonspecific low blood-pressure reading: Secondary | ICD-10-CM | POA: Diagnosis not present

## 2013-03-27 DIAGNOSIS — J189 Pneumonia, unspecified organism: Secondary | ICD-10-CM | POA: Diagnosis present

## 2013-03-27 DIAGNOSIS — J9600 Acute respiratory failure, unspecified whether with hypoxia or hypercapnia: Principal | ICD-10-CM | POA: Diagnosis present

## 2013-03-27 DIAGNOSIS — IMO0002 Reserved for concepts with insufficient information to code with codable children: Secondary | ICD-10-CM | POA: Diagnosis present

## 2013-03-27 DIAGNOSIS — Z923 Personal history of irradiation: Secondary | ICD-10-CM

## 2013-03-27 DIAGNOSIS — F32A Depression, unspecified: Secondary | ICD-10-CM

## 2013-03-27 DIAGNOSIS — F329 Major depressive disorder, single episode, unspecified: Secondary | ICD-10-CM

## 2013-03-27 DIAGNOSIS — N179 Acute kidney failure, unspecified: Secondary | ICD-10-CM | POA: Diagnosis present

## 2013-03-27 DIAGNOSIS — F17200 Nicotine dependence, unspecified, uncomplicated: Secondary | ICD-10-CM | POA: Diagnosis present

## 2013-03-27 DIAGNOSIS — J9601 Acute respiratory failure with hypoxia: Secondary | ICD-10-CM

## 2013-03-27 DIAGNOSIS — I10 Essential (primary) hypertension: Secondary | ICD-10-CM

## 2013-03-27 DIAGNOSIS — H409 Unspecified glaucoma: Secondary | ICD-10-CM | POA: Diagnosis present

## 2013-03-27 DIAGNOSIS — I639 Cerebral infarction, unspecified: Secondary | ICD-10-CM | POA: Diagnosis present

## 2013-03-27 DIAGNOSIS — Z87442 Personal history of urinary calculi: Secondary | ICD-10-CM

## 2013-03-27 DIAGNOSIS — I4891 Unspecified atrial fibrillation: Secondary | ICD-10-CM | POA: Diagnosis present

## 2013-03-27 DIAGNOSIS — Z66 Do not resuscitate: Secondary | ICD-10-CM | POA: Diagnosis not present

## 2013-03-27 DIAGNOSIS — N170 Acute kidney failure with tubular necrosis: Secondary | ICD-10-CM | POA: Diagnosis present

## 2013-03-27 DIAGNOSIS — Z515 Encounter for palliative care: Secondary | ICD-10-CM

## 2013-03-27 DIAGNOSIS — E43 Unspecified severe protein-calorie malnutrition: Secondary | ICD-10-CM | POA: Insufficient documentation

## 2013-03-27 DIAGNOSIS — Z9079 Acquired absence of other genital organ(s): Secondary | ICD-10-CM

## 2013-03-27 DIAGNOSIS — C3490 Malignant neoplasm of unspecified part of unspecified bronchus or lung: Secondary | ICD-10-CM

## 2013-03-27 DIAGNOSIS — K219 Gastro-esophageal reflux disease without esophagitis: Secondary | ICD-10-CM | POA: Diagnosis present

## 2013-03-27 DIAGNOSIS — T17408A Unspecified foreign body in trachea causing other injury, initial encounter: Secondary | ICD-10-CM | POA: Diagnosis present

## 2013-03-27 DIAGNOSIS — Z79899 Other long term (current) drug therapy: Secondary | ICD-10-CM

## 2013-03-27 HISTORY — DX: Other specified postprocedural states: Z98.890

## 2013-03-27 HISTORY — DX: Adverse effect of unspecified anesthetic, initial encounter: T41.45XA

## 2013-03-27 HISTORY — DX: Nausea with vomiting, unspecified: R11.2

## 2013-03-27 HISTORY — DX: Other complications of anesthesia, initial encounter: T88.59XA

## 2013-03-27 LAB — BLOOD GAS, ARTERIAL
Acid-Base Excess: 0.8 mmol/L (ref 0.0–2.0)
Drawn by: 331471
FIO2: 1 %
O2 Saturation: 98.8 %
Patient temperature: 98.6
TCO2: 21.9 mmol/L (ref 0–100)
pCO2 arterial: 39 mmHg (ref 35.0–45.0)

## 2013-03-27 LAB — CBC
HCT: 42.6 % (ref 39.0–52.0)
MCH: 27.6 pg (ref 26.0–34.0)
MCHC: 30.5 g/dL (ref 30.0–36.0)
MCV: 90.4 fL (ref 78.0–100.0)
Platelets: 431 10*3/uL — ABNORMAL HIGH (ref 150–400)
RDW: 15.2 % (ref 11.5–15.5)
WBC: 18.2 10*3/uL — ABNORMAL HIGH (ref 4.0–10.5)

## 2013-03-27 LAB — COMPREHENSIVE METABOLIC PANEL
Albumin: 3.1 g/dL — ABNORMAL LOW (ref 3.5–5.2)
Alkaline Phosphatase: 127 U/L — ABNORMAL HIGH (ref 39–117)
BUN: 26 mg/dL — ABNORMAL HIGH (ref 6–23)
CO2: 26 mEq/L (ref 19–32)
Creatinine, Ser: 1.16 mg/dL (ref 0.50–1.35)
GFR calc Af Amer: 68 mL/min — ABNORMAL LOW (ref >90)
GFR calc non Af Amer: 58 mL/min — ABNORMAL LOW (ref >90)
Glucose, Bld: 134 mg/dL — ABNORMAL HIGH (ref 70–99)
Potassium: 4.3 mEq/L (ref 3.5–5.1)
Sodium: 137 mEq/L (ref 135–145)
Total Protein: 7.2 g/dL (ref 6.0–8.3)

## 2013-03-27 LAB — HEMOGLOBIN A1C: Mean Plasma Glucose: 126 mg/dL — ABNORMAL HIGH (ref <117)

## 2013-03-27 LAB — PROCALCITONIN: Procalcitonin: <0.1 ng/mL

## 2013-03-27 MED ORDER — LATANOPROST 0.005 % OP SOLN
1.0000 [drp] | Freq: Every day | OPHTHALMIC | Status: DC
  Administered 2013-03-27 – 2013-03-30 (×4): 1 [drp] via OPHTHALMIC
  Filled 2013-03-27: qty 2.5

## 2013-03-27 MED ORDER — PAROXETINE HCL 20 MG PO TABS
40.0000 mg | ORAL_TABLET | Freq: Every day | ORAL | Status: DC
  Administered 2013-03-27 – 2013-03-29 (×3): 40 mg via ORAL
  Filled 2013-03-27 (×4): qty 2

## 2013-03-27 MED ORDER — SIMVASTATIN 10 MG PO TABS
10.0000 mg | ORAL_TABLET | Freq: Every day | ORAL | Status: DC
  Administered 2013-03-27 – 2013-03-29 (×3): 10 mg via ORAL
  Filled 2013-03-27 (×4): qty 1

## 2013-03-27 MED ORDER — SODIUM CHLORIDE 0.9 % IV SOLN
INTRAVENOUS | Status: DC
  Administered 2013-03-27 – 2013-03-28 (×3): via INTRAVENOUS
  Administered 2013-03-29: 1000 mL via INTRAVENOUS
  Administered 2013-03-29 – 2013-04-01 (×3): via INTRAVENOUS

## 2013-03-27 MED ORDER — VANCOMYCIN HCL 500 MG IV SOLR
500.0000 mg | Freq: Two times a day (BID) | INTRAVENOUS | Status: DC
  Administered 2013-03-28: 500 mg via INTRAVENOUS
  Filled 2013-03-27 (×2): qty 500

## 2013-03-27 MED ORDER — DM-GUAIFENESIN ER 30-600 MG PO TB12
1.0000 | ORAL_TABLET | Freq: Two times a day (BID) | ORAL | Status: DC
  Administered 2013-03-27 – 2013-03-29 (×3): 1 via ORAL
  Filled 2013-03-27 (×5): qty 1

## 2013-03-27 MED ORDER — LEVALBUTEROL HCL 0.63 MG/3ML IN NEBU
0.6300 mg | INHALATION_SOLUTION | Freq: Three times a day (TID) | RESPIRATORY_TRACT | Status: DC
  Administered 2013-03-27 – 2013-03-30 (×8): 0.63 mg via RESPIRATORY_TRACT
  Filled 2013-03-27 (×17): qty 3

## 2013-03-27 MED ORDER — TRAMADOL HCL 50 MG PO TABS: 50.0000 mg | ORAL_TABLET | Freq: Four times a day (QID) | ORAL | Status: DC | PRN

## 2013-03-27 MED ORDER — NIFEDIPINE ER 60 MG PO TB24
60.0000 mg | ORAL_TABLET | Freq: Every day | ORAL | Status: DC
  Administered 2013-03-27: 60 mg via ORAL
  Filled 2013-03-27 (×2): qty 1

## 2013-03-27 MED ORDER — SODIUM CHLORIDE 0.9 % IV SOLN: 500.0000 mg | Freq: Two times a day (BID) | INTRAVENOUS | Status: DC

## 2013-03-27 MED ORDER — VANCOMYCIN HCL IN DEXTROSE 750-5 MG/150ML-% IV SOLN
750.0000 mg | INTRAVENOUS | Status: AC
  Administered 2013-03-27: 750 mg via INTRAVENOUS
  Filled 2013-03-27: qty 150

## 2013-03-27 MED ORDER — ALBUTEROL SULFATE (5 MG/ML) 0.5% IN NEBU
5.0000 mg | INHALATION_SOLUTION | Freq: Once | RESPIRATORY_TRACT | Status: AC
  Administered 2013-03-27: 5 mg via RESPIRATORY_TRACT
  Filled 2013-03-27 (×2): qty 1

## 2013-03-27 MED ORDER — DEXTROSE 5 % IV SOLN
1.0000 g | Freq: Two times a day (BID) | INTRAVENOUS | Status: DC
  Administered 2013-03-28: 1 g via INTRAVENOUS
  Filled 2013-03-27 (×2): qty 1

## 2013-03-27 MED ORDER — HYDROCODONE-ACETAMINOPHEN 5-325 MG PO TABS: 1.0000 | ORAL_TABLET | Freq: Four times a day (QID) | ORAL | Status: DC | PRN

## 2013-03-27 MED ORDER — IOHEXOL 350 MG/ML SOLN
100.0000 mL | Freq: Once | INTRAVENOUS | Status: AC | PRN
  Administered 2013-03-27: 100 mL via INTRAVENOUS

## 2013-03-27 MED ORDER — SENNA 8.6 MG PO TABS
ORAL_TABLET | Freq: Every day | ORAL | Status: DC
  Administered 2013-03-29: 8.6 mg via ORAL
  Filled 2013-03-27: qty 1

## 2013-03-27 MED ORDER — METHYLPREDNISOLONE SODIUM SUCC 125 MG IJ SOLR
60.0000 mg | INTRAMUSCULAR | Status: DC
  Administered 2013-03-27: 60 mg via INTRAVENOUS
  Filled 2013-03-27 (×2): qty 0.96

## 2013-03-27 MED ORDER — DEXTROSE 5 % IV SOLN
1.0000 g | Freq: Three times a day (TID) | INTRAVENOUS | Status: DC
  Filled 2013-03-27: qty 1

## 2013-03-27 MED ORDER — INSULIN ASPART 100 UNIT/ML ~~LOC~~ SOLN
0.0000 [IU] | SUBCUTANEOUS | Status: DC
  Administered 2013-03-27: 2 [IU] via SUBCUTANEOUS
  Administered 2013-03-28 (×2): 1 [IU] via SUBCUTANEOUS
  Administered 2013-03-28: 2 [IU] via SUBCUTANEOUS
  Administered 2013-03-29 (×3): 1 [IU] via SUBCUTANEOUS

## 2013-03-27 MED ORDER — DEXTROSE 5 % IV SOLN
1.0000 g | Freq: Once | INTRAVENOUS | Status: AC
  Administered 2013-03-27: 1 g via INTRAVENOUS
  Filled 2013-03-27: qty 1

## 2013-03-27 MED ORDER — DOCUSATE SODIUM 100 MG PO CAPS
100.0000 mg | ORAL_CAPSULE | Freq: Every day | ORAL | Status: DC
  Administered 2013-03-27 – 2013-03-28 (×2): 100 mg via ORAL
  Filled 2013-03-27 (×4): qty 1

## 2013-03-27 MED ORDER — FAMOTIDINE 20 MG PO TABS
20.0000 mg | ORAL_TABLET | Freq: Two times a day (BID) | ORAL | Status: DC
  Administered 2013-03-27 – 2013-03-29 (×4): 20 mg via ORAL
  Filled 2013-03-27 (×7): qty 1

## 2013-03-27 NOTE — Progress Notes (Signed)
RN called RT about patients sats being in the 70's. RN increased oxygen and when RT arrived patients sats were 86%. RT increased oxygen from 2 L to 3 L. Sats remained the same. RT increased oxygen once more from 3L to 4L.RT gave patient xopenex breathing treatment. Patient tolerated well. Place patient back on nasal canula and sats were back in the high 80's. Increased oxygen from 4L to 5L. Sats would not go above 90%. Placed patient on a venti mask at 35% on 9 on flow meter. Sats still remaining around 90 to 91%. Changed patient from 35% to 50%. Patients oxygen remains 90 to 91%. RT Will continue to monitor patient.

## 2013-03-27 NOTE — H&P (Signed)
[facility] History and Physical  [name]:[id number] DOB: 1933-01-05 DOA: 03/22/2013  Referring physician:  PCP: Lolita Patella, MD  Specialists:   Chief Complaint: Pneumonia  HPI: [name] is a 79 y.o. male PMHx Hx CVA 2000/2002, diabetes type 2, HTN, HLD, glaucoma, BPH  S/P TURP. Squamous Cell Lung Cancer TTF-1  negative expression Cytokeratin 5 / 6 - strong diffuse expression diagnosed by biopsy on 03/04/2013.  On 03/19/2013 patient saw Dr. Dorothy Puffer (radiation oncologist) and per note it was decided patient would undergo XRT treatment for approximately 2-5-1/2 week course of treatment. Per note patient declined chemotherapy. On 03/14/2013 Dr. Gwenyth Bouillon (oncologist) saw patient and in his note he recommends bronchoscopic evaluation of left lower lobe. CURRENTLY patient sitting comfortably in bed on 2 L O2 via nasal cannula SpO2= 96%. Patient feels hungry and request daily. Family states that patient has lost significant weight in the intervening week somewhere in the neighborhood of 10 pounds. Patient states reason he sought care at the ED was secondary to SOB, negative cough, negative fever, negative N./V.    Review of Systems: The patient denies fever, vision loss, decreased hearing, hoarseness, chest pain, syncope,  peripheral edema, balance deficits, hemoptysis, abdominal pain, melena, hematochezia, severe indigestion/heartburn, hematuria, incontinence, genital sores, muscle weakness, suspicious skin lesions, transient blindness, difficulty walking, abnormal bleeding, enlarged lymph nodes, angioedema, and breast masses.    TRAVEL HISTORY:  None   Procedure CT angiogram chest PE protocol 03/07/2013 1. No pulmonary embolus is noted.  2. Again noted large left anterior mediastinal mass.  3. Again noted consolidation with air bronchogram in left lower lobe  posteriorly. Again noted obstruction of left lower lobe airway.  There is new obstruction of  right lower lobe airway. Partial  obstruction probable due to secretions or mucus plug of lateral  segment right upper lobe airway.  4. Osteopenia and degenerative changes thoracic spine. No  destructive bony lesions are noted.  CXR 03/29/2013 Left perihilar mass corresponding to anterior mediastinal mass  identified on recent CT.  Left basilar atelectasis.    Antibiotics Cefepime 11/26>> Vancomycin 11/26>>>    Past Medical History  Diagnosis Date  . Right inguinal hernia october 2012  . Diabetes mellitus   . Hypertension   . Glaucoma   . Colon polyps   . Osteoporosis     spine  . GERD (gastroesophageal reflux disease)   . Hand eczema   . Hyperlipidemia   . Aphasia   . Depression   . BPH (benign prostatic hyperplasia)   . Bladder stones 10/2010  . Lung cancer 03/04/13    left  . Anemia   . 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 Complication of anesthesia     hard to wake up  . PONV (postoperative nausea and vomiting)    Past Surgical History  Procedure Laterality Date  . Transurethral resection of prostate  June 2012    Removal of bladder stone  . Hip arthroplasty  April 2012    Dr. Shelle Iron  . Hernia repair  03/04/11    lap bilateral inguinal hernia repair   . Lung biopsy  03/04/13    squamous cell ca/mediatinum   Social History:  reports that he has been smoking Cigarettes.  He has a 60 pack-year smoking history (continues to smoke 2 PPD) . He has never used smokeless tobacco. He reports that he does not drink alcohol or use illicit drugs.  where does patient live--home,  Can patient participate in ADLs? No  No Known Allergies  History reviewed. No pertinent family history.   Prior to Admission medications   Medication Sig Start Date End Date Taking? Authorizing Provider  acetaminophen-codeine (TYLENOL #3) 300-30 MG per tablet Take 1 tablet by mouth every 4 (four) hours as needed for pain.   Yes Historical Provider, MD  bimatoprost  (LUMIGAN) 0.03 % ophthalmic solution Place 1 drop into both eyes at bedtime.   Yes Historical Provider, MD  docusate sodium (COLACE) 100 MG capsule Take 100 mg by mouth at bedtime.   Yes Historical Provider, MD  HYDROcodone-acetaminophen (NORCO/VICODIN) 5-325 MG per tablet Take 1-2 tablets by mouth every 6 (six) hours as needed for moderate pain or severe pain.   Yes Historical Provider, MD  NIFEdipine (PROCARDIA XL/ADALAT-CC) 60 MG 24 hr tablet Take 60 mg by mouth at bedtime.   Yes Historical Provider, MD  PARoxetine (PAXIL) 40 MG tablet Take 40 mg by mouth at bedtime.    Yes Historical Provider, MD  ranitidine (ZANTAC) 150 MG tablet Take 150 mg by mouth 2 (two) times daily.  12/17/10  Yes Historical Provider, MD  senna (SENOKOT) 8.6 MG TABS Take 2 tablets by mouth at bedtime.    Yes Historical Provider, MD  Sennosides (EX-LAX PO) Take 1 tablet by mouth daily.   Yes Historical Provider, MD  simvastatin (ZOCOR) 10 MG tablet Take 10 mg by mouth at bedtime.  12/17/10  Yes Historical Provider, MD  traMADol (ULTRAM) 50 MG tablet Take 50 mg by mouth every 6 (six) hours as needed for pain.   Yes Historical Provider, MD   Physical Exam: Filed Vitals:   13-Apr-2013 1615 04/13/2013 1700 04-13-13 1710 April 13, 2013 1800  BP:   164/106 131/75  Pulse:   117 57  Temp:   98.1 F (36.7 C)   TempSrc:   Oral   Resp:   25 23  Height:   6' (1.829 m)   Weight:  63.5 kg (139 lb 15.9 oz)    SpO2: 94%  96% 96%     General:  A./O. x4, NAD  Eyes: Pupils equal round reactive to light and  Neck: Negative JVD  Cardiovascular: Regular rhythm, tachycardic, negative murmurs rubs gallops, DP/PT pulse one plus bilateral  Respiratory: Left lower lobe little to no air movement appreciated, lingula/left upper lobe positive loud rhonchi, right upper lobe moderate air movement rhonchi, right middle lobe poor air movement rhonchi, right lower lobe moderate air movement rhonchi   Abdomen: Soft nontender nondistended plus bowel  some  Skin: Tenting the skin, negative rashes  Musculoskeletal: Cachectic, negative pedal edema  Neurologic: Cranial nerves II through XII intact, extremity strength 5/5, sensation intact  Labs on Admission:  Basic Metabolic Panel:  Recent Labs Lab 13-Apr-2013 1135  NA 137  K 4.3  CL 100  CO2 26  GLUCOSE 134*  BUN 26*  CREATININE 1.16  CALCIUM 9.9   Liver Function Tests:  Recent Labs Lab 04/13/2013 1135  AST 8  ALT 6  ALKPHOS 127*  BILITOT 0.4  PROT 7.2  ALBUMIN 3.1*   No results found for this basename: LIPASE, AMYLASE,  in the last 168 hours No results found for this basename: AMMONIA,  in the last 168 hours CBC:  Recent Labs Lab 13-Apr-2013 1135  WBC 18.2*  HGB 13.0  HCT 42.6  MCV 90.4  PLT 431*   Cardiac Enzymes: No results found for this basename: CKTOTAL, CKMB, CKMBINDEX, TROPONINI,  in the last 168 hours  BNP (last 3 results)  Recent Labs  03/30/2013 1135  PROBNP 411.0   CBG: No results found for this basename: GLUCAP,  in the last 168 hours  Radiological Exams on Admission: Ct Angio Chest Pe W/cm &/or Wo Cm  03/14/2013   CLINICAL DATA:  Shortness of breath, cough, lung cancer  EXAM: CT ANGIOGRAPHY CHEST WITH CONTRAST  TECHNIQUE: Multidetector CT imaging of the chest was performed using the standard protocol during bolus administration of intravenous contrast. Multiplanar CT image reconstructions including MIPs were obtained to evaluate the vascular anatomy.  CONTRAST:  OMNIPAQUE IOHEXOL 350 MG/ML SOLN  COMPARISON:  02/08/2013  FINDINGS: Again noted large left anterior mediastinal mass without change from prior exam.  The study is of excellent technical quality. No pulmonary embolus is noted. Trace pericardial effusion. Atherosclerotic calcifications of coronary arteries and thoracic aorta. Splenic artery calcifications. No adrenal gland mass is noted in visualized upper abdomen.  Sagittal images of the spine shows diffuse osteopenia and degenerative  changes thoracic spine.  No destructive rib lesions are noted. There is elevation of the left hemidiaphragm. There is left lower lobe posterior consolidation with air bronchogram without change from prior exam. Again noted obstruction of left lower lobe airway. No precarinal or hilar adenopathy.  There is new obstruction probable due to secretions of right lower lobe airway as seen in image 45. There is new obstruction of right upper lobe lateral segment airway as seen image 54.  No aortic aneurysm.  Review of the MIP images confirms the above findings.  IMPRESSION: 1. No pulmonary embolus is noted. 2. Again noted large left anterior mediastinal mass. 3. Again noted consolidation with air bronchogram in left lower lobe posteriorly. Again noted obstruction of left lower lobe airway. There is new obstruction of right lower lobe airway. Partial obstruction probable due to secretions or mucus plug of lateral segment right upper lobe airway. 4. Osteopenia and degenerative changes thoracic spine. No destructive bony lesions are noted.   Electronically Signed   By: Natasha Mead M.D.   On: 03/19/2013 14:00   Dg Chest Portable 1 View  03/12/2013   CLINICAL DATA:  Shortness of breath, hypertension, diabetes, GERD, lung cancer, smoker, stroke  EXAM: PORTABLE CHEST - 1 VIEW  COMPARISON:  03/04/2013 CT chest  FINDINGS: Normal heart size.  Atherosclerotic calcification aorta.  Left perihilar mass corresponding to anterior mediastinal mass identified on recent CT.  Left basilar atelectasis.  Remaining lungs clear.  No definite pleural effusion or pneumothorax.  Bones unremarkable.  IMPRESSION: Left perihilar mass corresponding to anterior mediastinal mass identified on recent CT. Left basilar atelectasis.   Electronically Signed   By: Ulyses Southward M.D.   On: 03/03/2013 11:56    EKG: Sinus tachycardia, incomplete RBBB, cannot rule out LAH, nonspecific ST-T wave changes 3 and aVF  Assessment/Plan Principal Problem:   HCAP  (healthcare-associated pneumonia) Active Problems:   Stroke   Squamous cell lung cancer   Pneumonia   Type II or unspecified type diabetes mellitus with neurological manifestations, not stated as uncontrolled(250.60)   HTN (hypertension)   HLD (hyperlipidemia)   Glaucoma   BPH (benign prostatic hyperplasia)   HCAP -Continue cefepime and vancomycin -Methylprednisolone 60 mg daily -Xopenex nebulizer 3 times a day -Mucinex DM for mucus control -Flutter valve q. 2 hour when awake -Patient's pneumonia secondary to pulmonary obstruction will contact Dr. Gwenyth Bouillon in the a.m.; bronchoscopy and stenting? -Aggressive hydration normal saline at 113ml/hr -Sputum will be sent for culture when obtained  Squamous cell lung cancer -In the a.m. Contact Dr. Dorothy Puffer (radiation oncologist); after patient slightly more stable start radiation therapy while inpatient?  Diabetes type 2 -Patient not on any home medication however we will control with sensitive SSI -Obtain hemoglobin A1c  HLD -Continue simvastatin 10 -Obtain lipid panel  HTN -Continue Procardia 60 mg daily  BPH -Currently on no home medication, will monitor for abnormal retention.  Depression -Continue Paxil 40 mg daily     Code Status: Full Family Communication: Family present for discussion of plan of care Disposition Plan: Per oncology/pulmonology  Time spent: 120 minutes  WOODS, Roselind Messier [facility] Pager [phone]  If 7PM-7AM, please contact night-coverage [email or web address] Password [facility] 03/24/2013, 7:21 PM

## 2013-03-27 NOTE — Progress Notes (Signed)
Utilization Review completed.  Alyia Lacerte RN CM  

## 2013-03-27 NOTE — ED Notes (Signed)
Pt arterial pO2 161. Pt taken off nrb and placed on 4 lpm [place].

## 2013-03-27 NOTE — ED Notes (Signed)
Per EMS pt hx hx of lung cancer. Pt complaint of SOB when awoke this am. Pt denies pain.

## 2013-03-27 NOTE — Progress Notes (Addendum)
ANTIBIOTIC CONSULT NOTE - INITIAL  Pharmacy Consult for:  Vancomycin and antibiotic renal dose adjustment Indication:  Suspected pneumonia   No Known Allergies  Patient Measurements: 03/22/13 - Height 72 inches  Weight 66.7 kg   Vital Signs: Temp: 98.7 F (37.1 C) (11/26 1111) Temp src: Oral (11/26 1111) BP: 151/61 mmHg (11/26 1530) Pulse Rate: 109 (11/26 1530)  Labs:  Recent Labs  2013/04/19 1135  WBC 18.2*  HGB 13.0  PLT 431*  CREATININE 1.16   The estimated creatinine clearance is 49.5 ml/min from SCr 1.16 using the Cockroft-Gault formula.   Medical History: Past Medical History  Diagnosis Date  . Right inguinal hernia october 2012  . Diabetes mellitus   . Hypertension   . Glaucoma   . Colon polyps   . Osteoporosis     spine  . GERD (gastroesophageal reflux disease)   . Hand eczema   . Hyperlipidemia   . Aphasia   . Depression   . BPH (benign prostatic hyperplasia)   . Bladder stones 10/2010  . Lung cancer 03/04/13    left  . Anemia   . 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 Complication of anesthesia     hard to wake up  . PONV (postoperative nausea and vomiting)      Medications (PTA List):  Acetaminophen-codeine (Tylenol #3), bimatoprost (Lumigan) eye drops, docusate (Colace), hydrocodone-acetaminophen, nifedipine (Procardia XL), paroxetine (Paxil), ranitidine (Zantac), senna (Senokot), sennosides (Ex-Lax), simvastatin (Zocor), tramadol (Ultram)   Assessment:  Asked to assist with Vancomycin therapy x 8 days for this 79 year-old male with suspected pneumonia and recent diagnosis of non-small cell lung cancer  ED visit on 11/26 is due to increased difficulty breathing   Goals of Therapy:    Vancomycin trough levels 15-20 mcg/ml  Antibiotic doses appropriate for renal function  Eradication of infection  Plan:   Vancomycin 750 mg IV x 1 dose, then 500 mg IV every 12 hours  Vancomycin levels as needed to guide  dosing  Modify Cefepime dose to 1 gram IV every 12 hours due to CrCl 47 ml/min.  Polo Riley R.Ph. 2013-04-19 4:00 PM

## 2013-03-27 NOTE — ED Notes (Signed)
Bed: WA21 Expected date:  Expected time:  Means of arrival:  Comments: SOB 

## 2013-03-27 NOTE — ED Notes (Signed)
Resp. called for arterial blood gas and neb treatment.

## 2013-03-27 NOTE — ED Provider Notes (Signed)
CSN: [id number]     Arrival date & time 04-21-13  1104 History   First MD Initiated Contact with Patient 21-Apr-2013 1111     Chief Complaint  Patient presents with  . Shortness of Breath   (Consider location/radiation/quality/duration/timing/severity/associated sxs/prior Treatment) The history is provided by the patient, medical records and the EMS personnel. No language interpreter was used.   This is a 79 year old male with a past medical history recently diagnosed mediastinal mass, lung cancer, history of diabetes hypertension who presents the emergency department with shortness of breath.  Patient states he is normally on home oxygen.  He said for the past 6 hours he had increased difficulty breathing, patient notes form EMS state that he was 80%b on 4L via cannula. Patient was brought in on 100% 02 NRB.  He endorses fevers at home.   Past Medical History  Diagnosis Date  . Right inguinal hernia october 2012  . Diabetes mellitus   . Hypertension   . Glaucoma   . Colon polyps   . Osteoporosis     spine  . GERD (gastroesophageal reflux disease)   . Hand eczema   . Hyperlipidemia   . Aphasia   . Depression   . BPH (benign prostatic hyperplasia)   . Bladder stones 10/2010  . Lung cancer 03/04/13    left  . Anemia   . 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 Transurethral resection of prostate  June 2012    Removal of bladder stone  . Hip arthroplasty  April 2012    Dr. Shelle Iron  . Hernia repair  03/04/11    lap bilateral inguinal hernia repair   . Lung biopsy  03/04/13    squamous cell ca/mediatinum   No family history on file. History  Substance Use Topics  . Smoking status: Current Every Day Smoker -- 1.00 packs/day for 60 years    Types: Cigarettes  . Smokeless tobacco: Never Used  . Alcohol Use: No    Review of Systems  Unable to perform ROS: Acuity of condition    Allergies  Review of patient's  allergies indicates no known allergies.  Home Medications   Current Outpatient Rx  Name  Route  Sig  Dispense  Refill  . acetaminophen-codeine (TYLENOL #3) 300-30 MG per tablet   Oral   Take 1 tablet by mouth every 4 (four) hours as needed for pain.         . bimatoprost (LUMIGAN) 0.03 % ophthalmic solution   Both Eyes   Place 1 drop into both eyes at bedtime.         . docusate sodium (COLACE) 100 MG capsule   Oral   Take 100 mg by mouth at bedtime.         Marland Kitchen HYDROcodone-acetaminophen (NORCO/VICODIN) 5-325 MG per tablet   Oral   Take 1-2 tablets by mouth every 6 (six) hours as needed for moderate pain or severe pain.         Marland Kitchen NIFEdipine (PROCARDIA XL/ADALAT-CC) 60 MG 24 hr tablet   Oral   Take 60 mg by mouth at bedtime.         Marland Kitchen PARoxetine (PAXIL) 40 MG tablet   Oral   Take 40 mg by mouth at bedtime.          . ranitidine (ZANTAC) 150 MG tablet   Oral   Take 150 mg by mouth 2 (two) times  daily.          . senna (SENOKOT) 8.6 MG TABS   Oral   Take 2 tablets by mouth at bedtime.          . Sennosides (EX-LAX PO)   Oral   Take 1 tablet by mouth daily.         . simvastatin (ZOCOR) 10 MG tablet   Oral   Take 10 mg by mouth at bedtime.          . traMADol (ULTRAM) 50 MG tablet   Oral   Take 50 mg by mouth every 6 (six) hours as needed for pain.          BP 129/84  Pulse 109  Temp(Src) 98.7 F (37.1 C) (Oral)  Resp 20  SpO2 100% Physical Exam  Nursing note and vitals reviewed. Constitutional:  Thin, ill appearing male.  Labored breathing with audible ronchi.  HENT:  Head: Normocephalic and atraumatic.  Eyes: Conjunctivae are normal.  Neck: Normal range of motion. No JVD present.  Cardiovascular: Normal rate and regular rhythm.   Pulmonary/Chest: Effort normal.  tachypneic Decreased air in the bases. Diffuse ronchi  Abdominal: Soft. He exhibits no distension. There is no tenderness.  Neurological: He is alert.  Skin: Skin is  warm and dry.    ED Course  Procedures (including critical care time) Labs Review Labs Reviewed  BLOOD GAS, ARTERIAL - Abnormal; Notable for the following:    pO2, Arterial 161.0 (*)    Bicarbonate 24.7 (*)    All other components within normal limits  CBC  PRO B NATRIURETIC PEPTIDE  COMPREHENSIVE METABOLIC PANEL   Imaging Review No results found.  EKG Interpretation    Date/Time:  Wednesday March 27 2013 11:11:30 EST Ventricular Rate:  111 PR Interval:  143 QRS Duration: 97 QT Interval:  339 QTC Calculation: 461 R Axis:   81 Text Interpretation:  Sinus tachycardia Atrial premature complex Borderline right axis deviation Borderline T abnormalities, inferior leads ST depressions anteriorly Nonspecific ST and T wave abnormality Confirmed by RANCOUR  MD, STEPHEN ([id number]) on 03/22/2013 11:25:47 AM            MDM  No diagnosis found. Patient here with hypoxia, dyspnea. Known  Mediastinal mass.  Workup is currently pending.    Patient is afebrile with a leukocytosis.  He is chest x-ray is negative for pneumonia, or any acute changes.  Personally reviewed the images using our PACs system.  We will try to wean oxygen, progress with a CT angiogram to rule out pulmonary embolism.  The patient's CT angiogram negative for a pulmonary embolism.  Patient was ambulated with oxygen saturation monitoring and to desatted to 87% on room air.  He does not have home oxygen.  CT shows air bronchogram in the left lower lobe which is old however he has a new right lower lobe airway obstruction.  Seen to be increased secretions and mucous plug present.  Patient will be treated here for health care acquired pneumonia.   he will need admission with outpatient home oxygen after discharge.   Patient admitted to inpatient for HCAP.Patient will go to stepdown. The patient appears reasonably stabilized for admission considering the current resources, flow, and capabilities available in the ED at  this time, and I doubt any other [facility] requiring further screening and/or treatment in the ED prior to admission.   Arthor Captain, PA-C 03/29/13 1414

## 2013-03-28 ENCOUNTER — Inpatient Hospital Stay (HOSPITAL_COMMUNITY): Payer: Medicare Other

## 2013-03-28 DIAGNOSIS — C3410 Malignant neoplasm of upper lobe, unspecified bronchus or lung: Secondary | ICD-10-CM

## 2013-03-28 DIAGNOSIS — J9600 Acute respiratory failure, unspecified whether with hypoxia or hypercapnia: Principal | ICD-10-CM

## 2013-03-28 DIAGNOSIS — F329 Major depressive disorder, single episode, unspecified: Secondary | ICD-10-CM

## 2013-03-28 LAB — CBC WITH DIFFERENTIAL/PLATELET
Basophils Absolute: 0 10*3/uL (ref 0.0–0.1)
Basophils Relative: 0 % (ref 0–1)
Eosinophils Relative: 0 % (ref 0–5)
HCT: 40.8 % (ref 39.0–52.0)
HCT: 40.7 % (ref 39.0–52.0)
Hemoglobin: 12.6 g/dL — ABNORMAL LOW (ref 13.0–17.0)
Hemoglobin: 12.5 g/dL — ABNORMAL LOW (ref 13.0–17.0)
Lymphocytes Relative: 2 % — ABNORMAL LOW (ref 12–46)
Lymphs Abs: 0.6 10*3/uL — ABNORMAL LOW (ref 0.7–4.0)
MCH: 27.6 pg (ref 26.0–34.0)
MCV: 89.8 fL (ref 78.0–100.0)
Monocytes Absolute: 0.8 10*3/uL (ref 0.1–1.0)
Monocytes Absolute: 1.5 10*3/uL — ABNORMAL HIGH (ref 0.1–1.0)
Monocytes Relative: 3 % (ref 3–12)
Neutro Abs: 22.4 10*3/uL — ABNORMAL HIGH (ref 1.7–7.7)
Neutro Abs: 27.6 10*3/uL — ABNORMAL HIGH (ref 1.7–7.7)
RBC: 4.53 MIL/uL (ref 4.22–5.81)
RBC: 4.53 MIL/uL (ref 4.22–5.81)
RDW: 15 % (ref 11.5–15.5)
WBC: 23.8 10*3/uL — ABNORMAL HIGH (ref 4.0–10.5)
WBC: 29.7 10*3/uL — ABNORMAL HIGH (ref 4.0–10.5)

## 2013-03-28 LAB — GLUCOSE, CAPILLARY
Glucose-Capillary: 101 mg/dL — ABNORMAL HIGH (ref 70–99)
Glucose-Capillary: 120 mg/dL — ABNORMAL HIGH (ref 70–99)
Glucose-Capillary: 137 mg/dL — ABNORMAL HIGH (ref 70–99)
Glucose-Capillary: 138 mg/dL — ABNORMAL HIGH (ref 70–99)
Glucose-Capillary: 142 mg/dL — ABNORMAL HIGH (ref 70–99)

## 2013-03-28 LAB — COMPREHENSIVE METABOLIC PANEL
Albumin: 2.7 g/dL — ABNORMAL LOW (ref 3.5–5.2)
Alkaline Phosphatase: 109 U/L (ref 39–117)
BUN: 19 mg/dL (ref 6–23)
Calcium: 9 mg/dL (ref 8.4–10.5)
Chloride: 104 mEq/L (ref 96–112)
Creatinine, Ser: 1.01 mg/dL (ref 0.50–1.35)
GFR calc Af Amer: 80 mL/min — ABNORMAL LOW (ref >90)
GFR calc non Af Amer: 69 mL/min — ABNORMAL LOW (ref >90)
Glucose, Bld: 165 mg/dL — ABNORMAL HIGH (ref 70–99)
Sodium: 139 mEq/L (ref 135–145)
Total Protein: 6.3 g/dL (ref 6.0–8.3)

## 2013-03-28 LAB — BLOOD GAS, ARTERIAL
Acid-base deficit: 2.8 mmol/L — ABNORMAL HIGH (ref 0.0–2.0)
FIO2: 1 %
MECHVT: 620 mL
PEEP: 10 cmH2O
TCO2: 21.1 mmol/L (ref 0–100)
pCO2 arterial: 47.3 mmHg — ABNORMAL HIGH (ref 35.0–45.0)
pH, Arterial: 7.31 — ABNORMAL LOW (ref 7.350–7.450)

## 2013-03-28 LAB — LIPID PANEL
LDL Cholesterol: 36 mg/dL (ref 0–99)
Triglycerides: 89 mg/dL (ref <150)
VLDL: 18 mg/dL (ref 0–40)

## 2013-03-28 LAB — LACTIC ACID, PLASMA: Lactic Acid, Venous: 1.5 mmol/L (ref 0.5–2.2)

## 2013-03-28 LAB — STREP PNEUMONIAE URINARY ANTIGEN: Strep Pneumo Urinary Antigen: NEGATIVE

## 2013-03-28 LAB — HIV ANTIBODY (ROUTINE TESTING W REFLEX): HIV: NONREACTIVE

## 2013-03-28 MED ORDER — SODIUM CHLORIDE 0.9 % IV SOLN
0.0000 ug/h | INTRAVENOUS | Status: DC
  Administered 2013-03-28 – 2013-03-29 (×2): 50 ug/h via INTRAVENOUS
  Filled 2013-03-28 (×2): qty 50

## 2013-03-28 MED ORDER — VANCOMYCIN HCL 500 MG IV SOLR
500.0000 mg | Freq: Two times a day (BID) | INTRAVENOUS | Status: DC
  Administered 2013-03-28 – 2013-03-29 (×2): 500 mg via INTRAVENOUS
  Filled 2013-03-28 (×3): qty 500

## 2013-03-28 MED ORDER — ACETYLCYSTEINE 20 % IN SOLN
4.0000 mL | Freq: Once | RESPIRATORY_TRACT | Status: AC
  Administered 2013-03-28: 4 mL via RESPIRATORY_TRACT
  Filled 2013-03-28: qty 4

## 2013-03-28 MED ORDER — MIDAZOLAM HCL 2 MG/2ML IJ SOLN
INTRAMUSCULAR | Status: AC
  Administered 2013-03-28: 2 mg
  Filled 2013-03-28: qty 4

## 2013-03-28 MED ORDER — FENTANYL BOLUS VIA INFUSION
25.0000 ug | INTRAVENOUS | Status: DC | PRN
  Filled 2013-03-28: qty 50

## 2013-03-28 MED ORDER — NOREPINEPHRINE BITARTRATE 1 MG/ML IJ SOLN
2.0000 ug/min | INTRAVENOUS | Status: DC
  Administered 2013-03-28: 10 ug/min via INTRAVENOUS
  Administered 2013-03-28: 12 ug/min via INTRAVENOUS
  Administered 2013-03-29 (×2): 6 ug/min via INTRAVENOUS
  Filled 2013-03-28 (×5): qty 4

## 2013-03-28 MED ORDER — SODIUM CHLORIDE 0.9 % IV BOLUS (SEPSIS)
750.0000 mL | Freq: Once | INTRAVENOUS | Status: AC
  Administered 2013-03-28: 750 mL via INTRAVENOUS

## 2013-03-28 MED ORDER — FUROSEMIDE 10 MG/ML IJ SOLN
INTRAMUSCULAR | Status: AC
  Filled 2013-03-28: qty 8

## 2013-03-28 MED ORDER — MIDAZOLAM HCL 2 MG/2ML IJ SOLN
INTRAMUSCULAR | Status: AC
  Filled 2013-03-28: qty 2

## 2013-03-28 MED ORDER — CHLORHEXIDINE GLUCONATE 0.12 % MT SOLN
15.0000 mL | Freq: Two times a day (BID) | OROMUCOSAL | Status: DC
  Administered 2013-03-28 – 2013-03-29 (×4): 15 mL via OROMUCOSAL
  Filled 2013-03-28 (×3): qty 15

## 2013-03-28 MED ORDER — FUROSEMIDE 10 MG/ML IJ SOLN
80.0000 mg | Freq: Once | INTRAMUSCULAR | Status: AC
  Administered 2013-03-28: 80 mg via INTRAVENOUS

## 2013-03-28 MED ORDER — FENTANYL CITRATE 0.05 MG/ML IJ SOLN
INTRAMUSCULAR | Status: AC
  Administered 2013-03-28: 100 ug
  Filled 2013-03-28: qty 2

## 2013-03-28 MED ORDER — ENOXAPARIN SODIUM 40 MG/0.4ML ~~LOC~~ SOLN
40.0000 mg | SUBCUTANEOUS | Status: DC
  Administered 2013-03-28: 40 mg via SUBCUTANEOUS
  Filled 2013-03-28 (×2): qty 0.4

## 2013-03-28 MED ORDER — BIOTENE DRY MOUTH MT LIQD
15.0000 mL | Freq: Four times a day (QID) | OROMUCOSAL | Status: DC
  Administered 2013-03-28 – 2013-03-29 (×7): 15 mL via OROMUCOSAL

## 2013-03-28 MED ORDER — FENTANYL CITRATE 0.05 MG/ML IJ SOLN
50.0000 ug | Freq: Once | INTRAMUSCULAR | Status: AC
  Administered 2013-03-28: 50 ug via INTRAVENOUS

## 2013-03-28 MED ORDER — FENTANYL CITRATE 0.05 MG/ML IJ SOLN
INTRAMUSCULAR | Status: AC
  Administered 2013-03-28: 100 ug
  Filled 2013-03-28: qty 4

## 2013-03-28 MED ORDER — FENTANYL CITRATE 0.05 MG/ML IJ SOLN
INTRAMUSCULAR | Status: AC
  Filled 2013-03-28: qty 2

## 2013-03-28 MED ORDER — SODIUM CHLORIDE 0.9 % IV BOLUS (SEPSIS)
500.0000 mL | Freq: Once | INTRAVENOUS | Status: AC
  Administered 2013-03-28: 500 mL via INTRAVENOUS

## 2013-03-28 MED ORDER — SODIUM CHLORIDE 0.9 % IV SOLN
250.0000 mg | Freq: Four times a day (QID) | INTRAVENOUS | Status: DC
  Administered 2013-03-28 – 2013-03-31 (×10): 250 mg via INTRAVENOUS
  Filled 2013-03-28 (×13): qty 250

## 2013-03-28 NOTE — Progress Notes (Signed)
RN called RT about patients sats dropping to 88% on 50% venti mask. RT gave breathing treatment and placed patient on a non rebreather to keep sats above 92%. RT will continue to monitor.

## 2013-03-28 NOTE — Progress Notes (Signed)
[facility] PROGRESS NOTE  [name] ZOX:[id number] DOB: 22-Jul-1932 DOA: Apr 02, 2013 PCP: Lolita Patella, MD  Assessment/Plan:  HCAP  -Continue cefepime and vancomycin  -Methylprednisolone 60 mg daily  -Xopenex nebulizer 3 times a day  -Mucinex DM for mucus control  -Flutter valve q. 2 hour when awake  -Patient's pneumonia secondary to pulmonary obstruction will contact Dr. Gwenyth Bouillon in the a.m.; bronchoscopy and stenting?  -Aggressive hydration normal saline at 177ml/hr  -Sputum will be sent for culture when obtained  Squamous cell lung cancer  - Contacted Dr  Micah Flesher PCCM 2dary Respiratory distress; Therapeutic Bronchoscopy; NOTE: Dr  [name] has agreed to intubate and Broch Pt. - Dr  [name] will also contact Dr. Dorothy Puffer (radiation oncologist); i/o to arrange immediate XRT.  -PCCM will take Pt on their service  -Lasix 80mg  x 1 -Decrease IV fluids to 63ml/hr    Diabetes type 2  -Patient not on any home medication however we will control with sensitive SSI  -hemoglobin A1c 03/28/2013= 6.0   HLD  -Continue simvastatin 10  -lipid panel Pending  HTN  -Continue Procardia 60 mg daily   BPH  -Currently on no home medication, will monitor for abnormal retention.   Depression  -Continue Paxil 40 mg daily      Code Status: Full Family Communication:  Disposition Plan:   Procedure  PCXR 03/28/2013 Mild worsening bibasilar opacification which may be due to atelectasis/effusions although cannot exclude infection. Known stable left anterior mediastinal mass a projected over the left hilar region.  CT angiogram chest PE protocol 04/13/2013  1. No pulmonary embolus is noted.  2. Again noted large left anterior mediastinal mass.  3. Again noted consolidation with air bronchogram in left lower lobe  posteriorly. Again noted obstruction of left lower lobe airway.  There is new obstruction of right lower lobe airway. Partial  obstruction  probable due to secretions or mucus plug of lateral  segment right upper lobe airway.  4. Osteopenia and degenerative changes thoracic spine. No  destructive bony lesions are noted.   CXR 04/09/2013  Left perihilar mass corresponding to anterior mediastinal mass  identified on recent CT.  Left basilar atelectasis.   Antibiotics  Cefepime 11/26>>  Vancomycin 11/26>>>    HPI/Subjective: [name] is a 79 y.o. male PMHx Hx CVA 2000/2002, diabetes type 2, HTN, HLD, glaucoma, BPH S/P TURP. Squamous Cell Lung Cancer TTF-1 negative expression Cytokeratin 5 / 6 - strong diffuse expression diagnosed by biopsy on 03/04/2013. On 03/19/2013 patient saw Dr. Dorothy Puffer (radiation oncologist) and per note it was decided patient would undergo XRT treatment for approximately 2-5-1/2 week course of treatment. Per note patient declined chemotherapy. On 03/14/2013 Dr. Gwenyth Bouillon (oncologist) saw patient and in his note he recommends bronchoscopic evaluation of left lower lobe. CURRENTLY patient sitting comfortably in bed on 2 L O2 via nasal cannula SpO2= 96%. Patient feels hungry and request daily. Family states that patient has lost significant weight in the intervening week somewhere in the neighborhood of 10 pounds. Patient states reason he sought care at the ED was secondary to SOB, negative cough, negative fever, negative N./V.TODAY starting last night at approx 0046 Pt's SPO2 began to drop and [employer] Respiotory was called and Pt's O2 was eventually titrated up to a Venti Mask at 50% which inc SPO2 to 92 %. At approximately 0700 Pt's SPO2 again began to drop and [employer] RR mid -upper 20s. (88%) on Venti Mask 50%. Pt is A/O x4 but only  able to answer questions in 3-4 word sentences.    Objective: Filed Vitals:   03/15/2013 2105 03/28/13 0000 03/28/13 0400 03/28/13 0653  BP:  165/85 134/84   Pulse:   114   Temp:  97.9 F (36.6 C) 97.9 F (36.6 C)   TempSrc:  Axillary Oral   Resp:  26 25   Height:       Weight:      SpO2: 92% 94% 92% 89%    Intake/Output Summary (Last 24 hours) at 03/28/13 0748 Last data filed at 03/28/13 0700  Gross per 24 hour  Intake   2250 ml  Output     50 ml  Net   2200 ml   Filed Weights   03/14/2013 1542 03/29/2013 1700  Weight: 66.724 kg (147 lb 1.6 oz) 63.5 kg (139 lb 15.9 oz)    Exam: General: A./O. x4, In Acute Respiratory distress  Cardiovascular: Regular rhythm, tachycardic, negative murmurs rubs gallops, DP/PT pulse one plus bilateral  Respiratory: Left lower lobe little to no air movement appreciated, lingula/left upper lobe positive loud rhonchi (Inc fm last night), right upper lobe moderate air movement Increased rhonchi, right middle lobe poor air movement rhonchi, right lower lobe moderate air movement rhonchi  Abdomen: Soft nontender nondistended plus bowel some  Musculoskeletal: Cachectic, negative pedal edema     Data Reviewed: Basic Metabolic Panel:  Recent Labs Lab 03/07/2013 1135  NA 137  K 4.3  CL 100  CO2 26  GLUCOSE 134*  BUN 26*  CREATININE 1.16  CALCIUM 9.9   Liver Function Tests:  Recent Labs Lab 03/05/2013 1135  AST 8  ALT 6  ALKPHOS 127*  BILITOT 0.4  PROT 7.2  ALBUMIN 3.1*   No results found for this basename: LIPASE, AMYLASE,  in the last 168 hours No results found for this basename: AMMONIA,  in the last 168 hours CBC:  Recent Labs Lab 03/17/2013 1135  WBC 18.2*  HGB 13.0  HCT 42.6  MCV 90.4  PLT 431*   Cardiac Enzymes: No results found for this basename: CKTOTAL, CKMB, CKMBINDEX, TROPONINI,  in the last 168 hours BNP (last 3 results)  Recent Labs  03/18/2013 1135  PROBNP 411.0   CBG:  Recent Labs Lab 03/23/2013 2013 03/23/2013 2346 03/28/13 0327  GLUCAP 157* 137* 153*    Recent Results (from the past 240 hour(s))  MRSA PCR SCREENING     Status: None   Collection Time    03/13/2013  5:15 PM      Result Value Range Status   MRSA by PCR NEGATIVE  NEGATIVE Final   Comment:            The  GeneXpert MRSA Assay (FDA     approved for NASAL specimens     only), is one component of a     comprehensive MRSA colonization     surveillance program. It is not     intended to diagnose MRSA     infection nor to guide or     monitor treatment for     MRSA infections.     Studies: Ct Angio Chest Pe W/cm &/or Wo Cm  03/02/2013   CLINICAL DATA:  Shortness of breath, cough, lung cancer  EXAM: CT ANGIOGRAPHY CHEST WITH CONTRAST  TECHNIQUE: Multidetector CT imaging of the chest was performed using the standard protocol during bolus administration of intravenous contrast. Multiplanar CT image reconstructions including MIPs were obtained to evaluate the vascular anatomy.  CONTRAST:   OMNIPAQUE IOHEXOL 350 MG/ML SOLN  COMPARISON:  02/08/2013  FINDINGS: Again noted large left anterior mediastinal mass without change from prior exam.  The study is of excellent technical quality. No pulmonary embolus is noted. Trace pericardial effusion. Atherosclerotic calcifications of coronary arteries and thoracic aorta. Splenic artery calcifications. No adrenal gland mass is noted in visualized upper abdomen.  Sagittal images of the spine shows diffuse osteopenia and degenerative changes thoracic spine.  No destructive rib lesions are noted. There is elevation of the left hemidiaphragm. There is left lower lobe posterior consolidation with air bronchogram without change from prior exam. Again noted obstruction of left lower lobe airway. No precarinal or hilar adenopathy.  There is new obstruction probable due to secretions of right lower lobe airway as seen in image 45. There is new obstruction of right upper lobe lateral segment airway as seen image 54.  No aortic aneurysm.  Review of the MIP images confirms the above findings.  IMPRESSION: 1. No pulmonary embolus is noted. 2. Again noted large left anterior mediastinal mass. 3. Again noted consolidation with air bronchogram in left lower lobe posteriorly. Again  noted obstruction of left lower lobe airway. There is new obstruction of right lower lobe airway. Partial obstruction probable due to secretions or mucus plug of lateral segment right upper lobe airway. 4. Osteopenia and degenerative changes thoracic spine. No destructive bony lesions are noted.   Electronically Signed   By: Natasha Mead M.D.   On: 03/28/2013 14:00   Dg Chest Portable 1 View  03/24/2013   CLINICAL DATA:  Shortness of breath, hypertension, diabetes, GERD, lung cancer, smoker, stroke  EXAM: PORTABLE CHEST - 1 VIEW  COMPARISON:  03/04/2013 CT chest  FINDINGS: Normal heart size.  Atherosclerotic calcification aorta.  Left perihilar mass corresponding to anterior mediastinal mass identified on recent CT.  Left basilar atelectasis.  Remaining lungs clear.  No definite pleural effusion or pneumothorax.  Bones unremarkable.  IMPRESSION: Left perihilar mass corresponding to anterior mediastinal mass identified on recent CT. Left basilar atelectasis.   Electronically Signed   By: Ulyses Southward M.D.   On: 03/29/2013 11:56    Scheduled Meds: . ceFEPime (MAXIPIME) IV  1 g Intravenous Q12H  . dextromethorphan-guaiFENesin  1 tablet Oral BID  . docusate sodium  100 mg Oral QHS  . famotidine  20 mg Oral BID  . insulin aspart  0-9 Units Subcutaneous Q4H  . latanoprost  1 drop Both Eyes QHS  . levalbuterol  0.63 mg Nebulization Q8H  . methylPREDNISolone (SOLU-MEDROL) injection  60 mg Intravenous Q24H  . NIFEdipine  60 mg Oral Daily  . PARoxetine  40 mg Oral QHS  . senna   Oral Daily  . simvastatin  10 mg Oral QHS  . vancomycin  500 mg Intravenous Q12H   Continuous Infusions: . sodium chloride 150 mL/hr at 03/28/13 4782    Principal Problem:   HCAP (healthcare-associated pneumonia) Active Problems:   Stroke   Squamous cell lung cancer   Pneumonia   Type II or unspecified type diabetes mellitus with neurological manifestations, not stated as uncontrolled(250.60)   HTN (hypertension)   HLD  (hyperlipidemia)   Glaucoma   BPH (benign prostatic hyperplasia)   Depression    Time spent: 90 min   Edna Grover, J  [facility] Pager [phone]. If 7PM-7AM, please contact night-coverage at [email or web address], password [email or web address] 03/28/2013, 7:48 AM  LOS: 1 day

## 2013-03-28 NOTE — Procedures (Signed)
Bronchoscopy Procedure Note [name] [id number] April 29, 1933  Procedure: Bronchoscopy Indications: Diagnostic evaluation of the airways, Obtain specimens for culture and/or other diagnostic studies and Remove secretions  Procedure Details Consent: Risks of procedure as well as the alternatives and risks of each were explained to the (patient/caregiver).  Consent for procedure obtained. Time Out: Verified patient identification, verified procedure, site/side was marked, verified correct patient position, special equipment/implants available, medications/allergies/relevent history reviewed, required imaging and test results available.  Performed  In preparation for procedure, patient was given 100% FiO2 and bronchoscope lubricated. Sedation: Etomidate  Airway entered and the following bronchi were examined: RUL, RML, RLL, LUL, LLL and Bronchi.   Procedures performed: Brushings performed- no Bronchoscope removed.    Evaluation Hemodynamic Status: Transient hypotension treated with fluid; O2 sats: transiently fell during during procedure Patient's Current Condition: unstable Specimens:  Sent purulent fluid Complications: No apparent complications Patient did tolerate procedure well.   Martin Bucks. 03/28/2013   1. Pus thick yellow complete collapse RLL, LLL, suctioned clear 2. No mass lesions noted, small suction trauma appearing lesions at take off LLL, cytology sent 3. BAL LLL, pus thick 4. Upper lobe rt take off high at [place] and abnormal , 2 segments only and abnormal , no cancer noted 5. mucomysts to lll 10% 4 cc, then suctioned  [name]. [name], MD, FACP Pgr: [phone] [facility]

## 2013-03-28 NOTE — Progress Notes (Signed)
Patient is maintaining O2 at 92% on 50% venti mask. RT will continue to monitor.

## 2013-03-28 NOTE — Consult Note (Signed)
PULMONARY  / CRITICAL CARE MEDICINE  Name: MARLIN JARRARD MRN: [id number] DOB: Aug 27, 1932    ADMISSION DATE:  04-08-2013 CONSULTATION DATE:  03/28/2013 at [facility] MD :  Dr. 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  His respiratory status worsened overnight requiring 100%FiO2 on BiPAP for adequate saturation.  PCCM was consulted to resume his care in light of acute/rapidly respiratory failure.  His respiratory failure was likely secondary to mucous plugging causing by cancer mass causing extrinsic compression.  He was intubated, vented, and underwent bronchoscopy for washing.  SIGNIFICANT EVENTS / STUDIES:  11/26: CTA with large left anterior mediastinal mass without interval changes from previous  Consolidation of LLL  New obstruction of RLL  Negative for PE 11/27:  Underwent endotracheal intubation  Underwent bronchoscopy with washing and eval left lower lobe  LINES / TUBES: ET Tube 11/27 >>> Left IJ CVL 11/27 >>> Peripheral IVs 11/26 >>> Foley 11/26 >>>  CULTURES: Sputum culture 11/27 >>> Blood culture 11/26 >>>  ANTIBIOTICS: Vancomycin 11/26 >>> Primaxin 11/26 >>> Cefepime 11/26 >>>  HISTORY OF PRESENT ILLNESS:  Mr. Melander is a 79 year old caucasian male with CVA 2000/2002, T2DM, HTN, HLd, Glaucoma, BPH s/p TURP, and recently diagnosed squamous cell cancer of the lung.  He was being work up in outpatient and is being prep by oncology for RT to the mediastinal cancer mass.  He was brought to ED by EMS for SOB upon awaking yesterday morning.  Initial work up reveal unchanged cancer mass with new lung obstruction and PNA.  He got admitted to the floor for  further management with antibiotics.  Overnight, his respiratory status worsened that required 100% FiO2 on BiPAP for adequate oxygenation.  PCCM is consulted today to resume his care given acute respiratory.  Obstructive pulmonary mass is likely to cause his PNA and respiratory failure.  After discussion with daughter and patient, it is decided that we will temporarily intubate the patient despite his DNR status for bronchoscopy to see if we can improved his respiratory status with mucous deplugging as well as stenting if possible.  The patient was then intubated and bronchoscopy was done, mucous deplugging was done with saline as well as Mucomyst.  The patient tolerated the procedure well.  He became hypotensive while undergoing the procedure with sedation.  Given his status, left IJ CVL was also placed to support his care.  He tolerated off these procedures well.  He has no complaints beside in respiratory distress prior to intubation and sedation.  PAST MEDICAL HISTORY :  Past Medical History  Diagnosis Date  . Right inguinal hernia october 2012  . Diabetes mellitus   . Hypertension   . Glaucoma   . Colon polyps   . Osteoporosis     spine  . GERD (gastroesophageal reflux disease)   . Hand eczema   . Hyperlipidemia   . Aphasia   . Depression   . BPH (benign prostatic hyperplasia)   . Bladder stones 10/2010  . Lung cancer 03/04/13    left  . Anemia   . 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 Complication of anesthesia     hard to wake up  .  PONV (postoperative nausea and vomiting)    Past Surgical History  Procedure Laterality Date  . Transurethral resection of prostate  June 2012    Removal of bladder stone  . Hip arthroplasty  April 2012    Dr. Shelle Iron  . Hernia repair  03/04/11    lap bilateral inguinal hernia repair   . Lung biopsy  03/04/13    squamous cell ca/mediatinum   Prior to Admission medications   Medication Sig Start Date End Date Taking? Authorizing  Provider  acetaminophen-codeine (TYLENOL #3) 300-30 MG per tablet Take 1 tablet by mouth every 4 (four) hours as needed for pain.   Yes Historical Provider, MD  bimatoprost (LUMIGAN) 0.03 % ophthalmic solution Place 1 drop into both eyes at bedtime.   Yes Historical Provider, MD  docusate sodium (COLACE) 100 MG capsule Take 100 mg by mouth at bedtime.   Yes Historical Provider, MD  HYDROcodone-acetaminophen (NORCO/VICODIN) 5-325 MG per tablet Take 1-2 tablets by mouth every 6 (six) hours as needed for moderate pain or severe pain.   Yes Historical Provider, MD  NIFEdipine (PROCARDIA XL/ADALAT-CC) 60 MG 24 hr tablet Take 60 mg by mouth at bedtime.   Yes Historical Provider, MD  PARoxetine (PAXIL) 40 MG tablet Take 40 mg by mouth at bedtime.    Yes Historical Provider, MD  ranitidine (ZANTAC) 150 MG tablet Take 150 mg by mouth 2 (two) times daily.  12/17/10  Yes Historical Provider, MD  senna (SENOKOT) 8.6 MG TABS Take 2 tablets by mouth at bedtime.    Yes Historical Provider, MD  Sennosides (EX-LAX PO) Take 1 tablet by mouth daily.   Yes Historical Provider, MD  simvastatin (ZOCOR) 10 MG tablet Take 10 mg by mouth at bedtime.  12/17/10  Yes Historical Provider, MD  traMADol (ULTRAM) 50 MG tablet Take 50 mg by mouth every 6 (six) hours as needed for pain.   Yes Historical Provider, MD   No Known Allergies  FAMILY HISTORY:  History reviewed. No pertinent family history. SOCIAL HISTORY:  reports that he has been smoking Cigarettes.  He has a 60 pack-year smoking history. He has never used smokeless tobacco. He reports that he does not drink alcohol or use illicit drugs.  REVIEW OF SYSTEMS:  uable  SUBJECTIVE: severe distress  VITAL SIGNS: Temp:  [97.9 F (36.6 C)-98.7 F (37.1 C)] 97.9 F (36.6 C) (11/27 0400) Pulse Rate:  [57-117] 73 (11/27 0955) Resp:  [17-31] 20 (11/27 0955) BP: (59-165)/(36-106) 64/44 mmHg (11/27 0955) SpO2:  [62 %-100 %] 92 % (11/27 0955) FiO2 (%):  [50 %-100 %] 100 %  (11/27 0904) Weight:  [63.5 kg (139 lb 15.9 oz)-66.724 kg (147 lb 1.6 oz)] 63.5 kg (139 lb 15.9 oz) (11/26 1700) HEMODYNAMICS:   VENTILATOR SETTINGS: Vent Mode:  [-] PRVC FiO2 (%):  [50 %-100 %] 100 % Set Rate:  [20 bmp] 20 bmp Vt Set:  [161 mL] 620 mL PEEP:  [10 cmH20] 10 cmH20 Plateau Pressure:  [11 cmH20] 11 cmH20 INTAKE / OUTPUT: Intake/Output     11/26 0701 - 11/27 0700 11/27 0701 - 11/28 0700   I.V. (mL/kg) 1950 (30.7)    IV Piggyback 300    Total Intake(mL/kg) 2250 (35.4)    Urine (mL/kg/hr) 50    Total Output 50     Net +2200          Urine Occurrence 3 x    Stool Occurrence 1 x      PHYSICAL EXAMINATION: General:  Elderly,  ill appearing and depress gentleman in respiratory distress Neuro:  AAOx3, no focal nurological deficit, strength are appropriate HEENT:  NCAT, white and thining hair     EOMI, PERLA, no redness, no discharge     No apparent hearing loss, no discharge     No nasal discharge     Dry mucosal, missing teeth, and poor dental hygiene, no oral lesion prior to intubation Cardiovascular:  Sinus tachycardia, regular rhythm, no s3 nor s4, no murmur/rub/gallop Lungs:  I/E coarse wheezing in all lungs fields, coarse sounds post intubation Abdomen:  (+)BS, soft, non distended, thoraco-abdominal paradoxical movement with breathing Musculoskeletal:  Intact, appropriated strength, no edema, no lesion Skin:  Dry and warm to touch, no ulcer, no lesion  LABS:  CBC  Recent Labs Lab 2013-04-16 1135  WBC 18.2*  HGB 13.0  HCT 42.6  PLT 431*   Coag's No results found for this basename: APTT, INR,  in the last 168 hours BMET  Recent Labs Lab 2013-04-16 1135  NA 137  K 4.3  CL 100  CO2 26  BUN 26*  CREATININE 1.16  GLUCOSE 134*   Electrolytes  Recent Labs Lab 04/16/13 1135  CALCIUM 9.9   Sepsis Markers  Recent Labs Lab Apr 16, 2013 1146 04/16/2013 1834  LATICACIDVEN 0.97  --   PROCALCITON  --  <0.10   ABG  Recent Labs Lab 04-16-13 1127   PHART 7.418  PCO2ART 39.0  PO2ART 161.0*   Liver Enzymes  Recent Labs Lab April 16, 2013 1135  AST 8  ALT 6  ALKPHOS 127*  BILITOT 0.4  ALBUMIN 3.1*   Cardiac Enzymes  Recent Labs Lab April 16, 2013 1135  PROBNP 411.0   Glucose  Recent Labs Lab 04/16/13 2013 04-16-2013 2346 03/28/13 0327 03/28/13 0804  GLUCAP 157* 137* 153* 142*    Imaging Ct Angio Chest Pe W/cm &/or Wo Cm  16-Apr-2013   CLINICAL DATA:  Shortness of breath, cough, lung cancer  EXAM: CT ANGIOGRAPHY CHEST WITH CONTRAST  TECHNIQUE: Multidetector CT imaging of the chest was performed using the standard protocol during bolus administration of intravenous contrast. Multiplanar CT image reconstructions including MIPs were obtained to evaluate the vascular anatomy.  CONTRAST:  OMNIPAQUE IOHEXOL 350 MG/ML SOLN  COMPARISON:  02/08/2013  FINDINGS: Again noted large left anterior mediastinal mass without change from prior exam.  The study is of excellent technical quality. No pulmonary embolus is noted. Trace pericardial effusion. Atherosclerotic calcifications of coronary arteries and thoracic aorta. Splenic artery calcifications. No adrenal gland mass is noted in visualized upper abdomen.  Sagittal images of the spine shows diffuse osteopenia and degenerative changes thoracic spine.  No destructive rib lesions are noted. There is elevation of the left hemidiaphragm. There is left lower lobe posterior consolidation with air bronchogram without change from prior exam. Again noted obstruction of left lower lobe airway. No precarinal or hilar adenopathy.  There is new obstruction probable due to secretions of right lower lobe airway as seen in image 45. There is new obstruction of right upper lobe lateral segment airway as seen image 54.  No aortic aneurysm.  Review of the MIP images confirms the above findings.  IMPRESSION: 1. No pulmonary embolus is noted. 2. Again noted large left anterior mediastinal mass. 3. Again noted  consolidation with air bronchogram in left lower lobe posteriorly. Again noted obstruction of left lower lobe airway. There is new obstruction of right lower lobe airway. Partial obstruction probable due to secretions or mucus plug of lateral segment right upper  lobe airway. 4. Osteopenia and degenerative changes thoracic spine. No destructive bony lesions are noted.   Electronically Signed   By: Natasha Mead M.D.   On: 03/15/2013 14:00   Dg Chest Port 1 View  03/28/2013   CLINICAL DATA:  Post bronchoscopy.  EXAM: PORTABLE CHEST - 1 VIEW  COMPARISON:  03/28/2013 chest x-ray, 03/25/2013 chest CT, 02/22/2013 PET-CT.  FINDINGS: Endotracheal tube tip 4.8 cm above the carina.  Gas-filled stomach. This may be related to endotracheal tube placement. Esophageal intubation not felt be of clinical concern.  Anterior mediastinal mass projects over the left hilum.  Consolidation lung bases greater on the right. Consolidation of the right lung base has progressed since the prior examination. This may represent result of pneumonia potentially aspiration pneumonia. Atelectasis not excluded.  No gross pneumothorax.  Prominent skin fold on the right suspected.  Prominent right-sided nipple shadow.  Calcified aorta.  IMPRESSION: Endotracheal tube tip 4.8 cm above the carina.  Gas-filled stomach. This may be related to endotracheal tube placement. Esophageal intubation not felt be of clinical concern.  Anterior mediastinal mass projects over the left hilum.  Consolidation lung bases greater on the right. Consolidation of the right lung base has progressed since the prior examination. This may represent result of pneumonia potentially aspiration pneumonia. Atelectasis not excluded.  No gross pneumothorax.  Prominent skin fold on the right suspected.  These results were called by telephone at the time of interpretation on 03/28/2013 at 9:25 AM to Dr. Rory Percy , who verbally acknowledged these results.   Electronically Signed    By: Bridgett Larsson M.D.   On: 03/28/2013 09:30   Dg Chest Port 1 View  03/28/2013   CLINICAL DATA:  Difficulty breathing.  Lung cancer.  EXAM: PORTABLE CHEST - 1 VIEW  COMPARISON:  03/07/2013 and chest CT 03/04/2013  FINDINGS: Lungs are somewhat hypoinflated and demonstrate evidence of patient's known left-sided anterior mediastinal mass projected over the left hilar region. There is mild worsening bibasilar opacification which may be due to a combination of atelectasis and effusions. Cannot completely exclude infection. Remainder of the exam is unchanged.  IMPRESSION: Mild worsening bibasilar opacification which may be due to atelectasis/effusions although cannot exclude infection.  Known stable left anterior mediastinal mass a projected over the left hilar region.   Electronically Signed   By: Elberta Fortis M.D.   On: 03/28/2013 08:01   Dg Chest Portable 1 View  03/02/2013   CLINICAL DATA:  Shortness of breath, hypertension, diabetes, GERD, lung cancer, smoker, stroke  EXAM: PORTABLE CHEST - 1 VIEW  COMPARISON:  03/04/2013 CT chest  FINDINGS: Normal heart size.  Atherosclerotic calcification aorta.  Left perihilar mass corresponding to anterior mediastinal mass identified on recent CT.  Left basilar atelectasis.  Remaining lungs clear.  No definite pleural effusion or pneumothorax.  Bones unremarkable.  IMPRESSION: Left perihilar mass corresponding to anterior mediastinal mass identified on recent CT. Left basilar atelectasis.   Electronically Signed   By: Ulyses Southward M.D.   On: 03/20/2013 11:56     CXR: 11/27: No pneumothorax, proper ET tube placement  No pneumothorax, proper left IJ line placement  Mass and infiltrate demonstrated   ASSESSMENT / PLAN:  PULMONARY A: Acute Respiratory Failure  --> CAP/PNA, Mediastinal Cancer Mass  --> Mucous plug and obstruction CAP Squamous cell cancer of lung  P:   - Intubated, vent support, sedation with Fentanyl - 80FiO2, RR20, PEEP 10, 11mL/Kg; ABG  for further evaluation today, may infact need  higher peep to get to goal 60% - s/p bronchoscopy with mucous deplugging and washing - Sputum culture and cytology sent - Nebulized breathing treatments - Currently on Vacomycin and Primaxin - PNA seems to be the main issue, I did not see cancer obstruction - Planned RT to mediastinal cancer mass after PNA and respiratory status improved, I do not see cancer obstruction on bronch that would indicate emergent rads - Family aware that RT is pallative  CARDIOVASCULAR A:  Hypertension by History  --> Currently hypotensive  --> Likely due to sedation but at risk for sepsis and shock P:  - Hold antihypertensive - Aggressive IVF - Treat PNA as above - Pressor supports with Levophed -likely drop in BP sedation and pos pressure related, bolus and re eval -cvp -levophed to map goal 60  RENAL A:   Hypotension  P:   - Monitor urine output with Foley in place -bolus  -may need levophed to MAp GASTROINTESTINAL A:   GERD/Reflux base on Medication History At risk for C. Diff  P:   - Pepcid - Monitor diarrhea consider early feeding   HEMATOLOGIC A:   Leukocytosis Thrombocytosis  --> Likely reactive to PNA above P:  - Daily CBC - Lovenox for DVT prophylaxis in setting mech vent and cancer, crt to follow  INFECTIOUS A:   Pneumonia, concern was post obstructive, bronch NOT impressive for that cap  P:   - Currently on Cefepime, Vancomycin, change cefepime to IMI - Consider deescalate to just Zosyn  - Sputum culture//Cytology sent - Blood culture sent 11/26, pending  ENDOCRINE A:   T2DM  - Adequate control with A1c of 6 on admission   P:   - Insulin sliding scale - CBG  NEUROLOGIC A:   Depression - reactive to new cancer diagnosis Encephalopathy - secondary to sedation from being intubated History of CVA x2  P:   - Continue sedation for vent induced discomfort and agitation - Monitor neuro status daily - Daily  WUA - Wean vent and sedation ASAP -avoid versed  I have had extensive discussions with family daughter We discussed patients current circumstances and organ failures. We also discussed patient's prior wishes under circumstances such as this. Family has decided to NOT perform resuscitation if arrest but to continue current medical support for now. Would not want prolonged support    TODAY'S SUMMARY: PNA, resp failure, dnr established, vented  I have personally obtained a history, examined the patient, evaluated laboratory and imaging results, formulated the assessment and plan and placed orders. CRITICAL CARE: The patient is critically ill with multiple organ systems failure and requires high complexity decision making for assessment and support, frequent evaluation and titration of therapies, application of advanced monitoring technologies and extensive interpretation of multiple databases. Critical Care Time devoted to patient care services described in this note is 85  minutes.   Mcarthur Rossetti. [name], MD, FACP Pgr: [phone] [facility]  Pulmonary and Critical Care Medicine [facility] Pager: [phone]  03/28/2013, 10:09 AM

## 2013-03-28 NOTE — Progress Notes (Signed)
0900 PCCM  at bedside for intubation, bronch and central line insertion. During procedures total 300 fentanyl. 4 versed, 150 etomidate given iv per MD order.

## 2013-03-28 NOTE — Procedures (Signed)
Central Venous Catheter Insertion Procedure Note [name] [id number] 15-Aug-1932  Procedure: Insertion of Central Venous Catheter Indications: Assessment of intravascular volume, Drug and/or fluid administration and Frequent blood sampling  Procedure Details Consent: Risks of procedure as well as the alternatives and risks of each were explained to the (patient/caregiver).  Consent for procedure obtained. Time Out: Verified patient identification, verified procedure, site/side was marked, verified correct patient position, special equipment/implants available, medications/allergies/relevent history reviewed, required imaging and test results available.  Performed  Maximum sterile technique was used including antiseptics, cap, gloves, gown, hand hygiene, mask and sheet. Skin prep: Chlorhexidine; local anesthetic administered A antimicrobial bonded/coated triple lumen catheter was placed in the left internal jugular vein using the Seldinger technique.  Evaluation Blood flow good Complications: No apparent complications Patient did tolerate procedure well. Chest X-ray ordered to verify placement.  CXR: pending.  [name] 03/28/2013, 10:00 AM  [place] With Dr Ferd Glassing. Martin Alias, MD, FACP Pgr: [phone] [facility]

## 2013-03-28 NOTE — Procedures (Signed)
Intubation Procedure Note [name] [id number] January 12, 1933  Procedure: Intubation Indications: Respiratory insufficiency  Procedure Details Consent: Risks of procedure as well as the alternatives and risks of each were explained to the (patient/caregiver).  Consent for procedure obtained. Time Out: Verified patient identification, verified procedure, site/side was marked, verified correct patient position, special equipment/implants available, medications/allergies/relevent history reviewed, required imaging and test results available.  Performed  Maximum sterile technique was used including gloves, gown and hand hygiene.  MAC and 4    Evaluation Hemodynamic Status: BP stable throughout; O2 sats: stable throughout Patient's Current Condition: stable Complications: No apparent complications Patient did tolerate procedure well. Chest X-ray ordered to verify placement.  CXR: tube position acceptable.   [name] 03/28/2013  With Dr [name]

## 2013-03-28 NOTE — Progress Notes (Signed)
ANTIBIOTIC CONSULT NOTE - INITIAL  Pharmacy Consult for Primaxin Indication: post-obstructive PNA  No Known Allergies  Patient Measurements: Height: 6' (182.9 cm) Weight: 139 lb 15.9 oz (63.5 kg) IBW/kg (Calculated) : 77.6  Vital Signs: Temp: 97.9 F (36.6 C) (11/27 0400) Temp src: Oral (11/27 0400) BP: 64/44 mmHg (11/27 0955) Pulse Rate: 73 (11/27 0955) Intake/Output from previous day: 11/26 0701 - 11/27 0700 In: 2250 [I.V.:1950; IV Piggyback:300] Out: 50 [Urine:50] Intake/Output from this shift:    Labs:  Recent Labs  03/11/2013 1135  WBC 18.2*  HGB 13.0  PLT 431*  CREATININE 1.16   Estimated Creatinine Clearance: 47.1 ml/min (by C-G formula based on Cr of 1.16). No results found for this basename: VANCOTROUGH, Leodis Binet, VANCORANDOM, GENTTROUGH, GENTPEAK, GENTRANDOM, TOBRATROUGH, TOBRAPEAK, TOBRARND, AMIKACINPEAK, AMIKACINTROU, AMIKACIN,  in the last 72 hours   Microbiology: Recent Results (from the past 720 hour(s))  MRSA PCR SCREENING     Status: None   Collection Time    03/20/2013  5:15 PM      Result Value Range Status   MRSA by PCR NEGATIVE  NEGATIVE Final   Comment:            The GeneXpert MRSA Assay (FDA     approved for NASAL specimens     only), is one component of a     comprehensive MRSA colonization     surveillance program. It is not     intended to diagnose MRSA     infection nor to guide or     monitor treatment for     MRSA infections.  CULTURE, BLOOD (ROUTINE X 2)     Status: None   Collection Time    03/22/2013  7:20 PM      Result Value Range Status   Specimen Description BLOOD RIGHT ARM   Final   Special Requests BOTTLES DRAWN AEROBIC AND ANAEROBIC 5CC   Final   Culture  Setup Time     Final   Value: 03/10/2013 22:24     Performed at [facility]   Culture     Final   Value:        BLOOD CULTURE RECEIVED NO GROWTH TO DATE CULTURE WILL BE HELD FOR 5 DAYS BEFORE ISSUING A FINAL NEGATIVE REPORT     Performed at [facility]   Report Status PENDING   Incomplete  CULTURE, BLOOD (ROUTINE X 2)     Status: None   Collection Time    03/17/2013  7:32 PM      Result Value Range Status   Specimen Description BLOOD LEFT HAND   Final   Special Requests BOTTLES DRAWN AEROBIC ONLY 3CC   Final   Culture  Setup Time     Final   Value: 03/04/2013 22:24     Performed at [facility]   Culture     Final   Value:        BLOOD CULTURE RECEIVED NO GROWTH TO DATE CULTURE WILL BE HELD FOR 5 DAYS BEFORE ISSUING A FINAL NEGATIVE REPORT     Performed at [facility]   Report Status PENDING   Incomplete   Medical History: Past Medical History  Diagnosis Date  . Right inguinal hernia october 2012  . Diabetes mellitus   . Hypertension   . Glaucoma   . Colon polyps   . Osteoporosis     spine  . GERD (gastroesophageal reflux disease)   . Hand eczema   .  Hyperlipidemia   . Aphasia   . Depression   . BPH (benign prostatic hyperplasia)   . Bladder stones 10/2010  . Lung cancer 03/04/13    left  . Anemia   . 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 Complication of anesthesia     hard to wake up  . PONV (postoperative nausea and vomiting)    Medications:  Scheduled:  . dextromethorphan-guaiFENesin  1 tablet Oral BID  . docusate sodium  100 mg Oral QHS  . enoxaparin (LOVENOX) injection  40 mg Subcutaneous Q24H  . famotidine  20 mg Oral BID  . fentaNYL  50 mcg Intravenous Once  . imipenem-cilastatin  250 mg Intravenous Q6H  . insulin aspart  0-9 Units Subcutaneous Q4H  . latanoprost  1 drop Both Eyes QHS  . levalbuterol  0.63 mg Nebulization Q8H  . PARoxetine  40 mg Oral QHS  . senna   Oral Daily  . simvastatin  10 mg Oral QHS  . sodium chloride  750 mL Intravenous Once  . vancomycin  500 mg Intravenous Q12H   Assessment: 70 yoM with recently diagnosed SQ cell Lung Ca, no chemotherapy desired, planned XRT. Admit for sx of PNA, bronchoscopy today with complete RLL & LLL collapse  with mucus plugging, BAL obtained. Vancomycin and Cefepime started 11/26, d/c Cefepime, begin Primaxin today.  BAL today, already on abx, monitor culture, no growth blood cx 11/26  Goal of Therapy:  Abx dose/schedule appropriate for indication and renal function.   Plan:   Primaxin 250mg  q6hr, monitor renal function and adjust if needed  Monitor cultures, clinical course.  Otho Bellows PharmD Pager [phone] 03/28/2013, 11:51 AM

## 2013-03-29 DIAGNOSIS — J9601 Acute respiratory failure with hypoxia: Secondary | ICD-10-CM

## 2013-03-29 DIAGNOSIS — E43 Unspecified severe protein-calorie malnutrition: Secondary | ICD-10-CM | POA: Insufficient documentation

## 2013-03-29 LAB — GLUCOSE, CAPILLARY
Glucose-Capillary: 134 mg/dL — ABNORMAL HIGH (ref 70–99)
Glucose-Capillary: 96 mg/dL (ref 70–99)
Glucose-Capillary: 95 mg/dL (ref 70–99)
Glucose-Capillary: 139 mg/dL — ABNORMAL HIGH (ref 70–99)

## 2013-03-29 LAB — LEGIONELLA ANTIGEN, URINE: Legionella Antigen, Urine: NEGATIVE

## 2013-03-29 LAB — CBC WITH DIFFERENTIAL/PLATELET
Basophils Absolute: 0 10*3/uL (ref 0.0–0.1)
Eosinophils Absolute: 0 10*3/uL (ref 0.0–0.7)
Lymphocytes Relative: 4 % — ABNORMAL LOW (ref 12–46)
MCH: 28.4 pg (ref 26.0–34.0)
MCHC: 31.7 g/dL (ref 30.0–36.0)
MCV: 89.8 fL (ref 78.0–100.0)
Neutrophils Relative %: 90 % — ABNORMAL HIGH (ref 43–77)
Platelets: 426 10*3/uL — ABNORMAL HIGH (ref 150–400)
RDW: 15.3 % (ref 11.5–15.5)

## 2013-03-29 LAB — COMPREHENSIVE METABOLIC PANEL
ALT: <5 U/L (ref 0–53)
AST: 8 U/L (ref 0–37)
Albumin: 2.3 g/dL — ABNORMAL LOW (ref 3.5–5.2)
Alkaline Phosphatase: 92 U/L (ref 39–117)
Calcium: 9.2 mg/dL (ref 8.4–10.5)
Potassium: 4 mEq/L (ref 3.5–5.1)
Sodium: 137 mEq/L (ref 135–145)
Total Protein: 5.7 g/dL — ABNORMAL LOW (ref 6.0–8.3)

## 2013-03-29 LAB — VANCOMYCIN, TROUGH: Vancomycin Tr: 10.7 ug/mL (ref 10.0–20.0)

## 2013-03-29 MED ORDER — OSMOLITE 1.5 CAL PO LIQD
1000.0000 mL | ORAL | Status: DC
  Administered 2013-03-29: 1000 mL
  Filled 2013-03-29 (×2): qty 1000

## 2013-03-29 MED ORDER — VANCOMYCIN HCL IN DEXTROSE 750-5 MG/150ML-% IV SOLN
750.0000 mg | Freq: Two times a day (BID) | INTRAVENOUS | Status: DC
  Administered 2013-03-29 – 2013-03-30 (×2): 750 mg via INTRAVENOUS
  Filled 2013-03-29 (×4): qty 150

## 2013-03-29 MED ORDER — HYDROCORTISONE SOD SUCCINATE 100 MG IJ SOLR
50.0000 mg | Freq: Four times a day (QID) | INTRAMUSCULAR | Status: DC
  Administered 2013-03-29 (×3): 50 mg via INTRAVENOUS
  Filled 2013-03-29 (×4): qty 1
  Filled 2013-03-29 (×3): qty 2

## 2013-03-29 MED ORDER — VITAL AF 1.2 CAL PO LIQD: 1000.0000 mL | ORAL | Status: DC

## 2013-03-29 MED ORDER — HEPARIN SODIUM (PORCINE) 5000 UNIT/ML IJ SOLN
5000.0000 [IU] | Freq: Three times a day (TID) | INTRAMUSCULAR | Status: DC
  Administered 2013-03-29 – 2013-03-30 (×3): 5000 [IU] via SUBCUTANEOUS
  Filled 2013-03-29 (×9): qty 1

## 2013-03-29 MED ORDER — MIDAZOLAM HCL 2 MG/2ML IJ SOLN
2.0000 mg | Freq: Once | INTRAMUSCULAR | Status: AC
  Administered 2013-03-29: 2 mg via INTRAVENOUS

## 2013-03-29 NOTE — Progress Notes (Signed)
PULMONARY  / CRITICAL CARE MEDICINE  Name: [name] MRN: [id number] DOB: 1932/12/11    ADMISSION DATE:  03/24/2013 CONSULTATION DATE:  03/28/2013 at [facility] MD :  Dr. 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  His respiratory status worsened overnight requiring 100%FiO2 on BiPAP for adequate saturation.  PCCM was consulted to resume his care in light of acute/rapidly respiratory failure.  His respiratory failure was likely secondary to mucous plugging causing by cancer mass causing extrinsic compression.  He was intubated, vented, and underwent bronchoscopy for washing.  SIGNIFICANT EVENTS / STUDIES:  11/26: CTA with large left anterior mediastinal mass without interval changes from previous  Consolidation of LLL  New obstruction of RLL  Negative for PE 11/27:  Underwent endotracheal intubation  Underwent bronchoscopy with washing and eval left lower lobe  LINES / TUBES: ET Tube 11/27 >>> Left IJ CVL 11/27 >>> Peripheral IVs 11/26 >>> Foley 11/26 >>>  CULTURES: Sputum culture 11/27 >>> Blood culture 11/26 >>>  ANTIBIOTICS: Vancomycin 11/26 >>> Primaxin 11/26 >>> Cefepime 11/26 >>> 11/27  SUBJECTIVE:  Arousable with name calling and follow commands minimally though appear comfortable on vent and sedation.  VITAL SIGNS: Temp:  [97.8 F (36.6 C)-99.3 F (37.4 C)] 99.1 F (37.3 C) (11/28 0800) Pulse Rate:  [59-112] 110 (11/28 0830) Resp:  [15-26] 20 (11/28 0830) BP: (59-133)/(36-97) 101/53 mmHg (11/28 0841) SpO2:  [62 %-100 %] 97 % (11/28 0841) FiO2 (%):  [50 %-100 %] 50 % (11/28 0841) Weight:  [68.1 kg (150 lb 2.1 oz)] 68.1 kg (150 lb 2.1 oz)  (11/28 0400) HEMODYNAMICS: CVP:  [7 mmHg-10 mmHg] 9 mmHg VENTILATOR SETTINGS: Vent Mode:  [-] PRVC FiO2 (%):  [50 %-100 %] 50 % Set Rate:  [20 bmp] 20 bmp Vt Set:  [409 mL] 620 mL PEEP:  [10 cmH20] 10 cmH20 Plateau Pressure:  [11 cmH20-26 cmH20] 23 cmH20 INTAKE / OUTPUT: Intake/Output     11/27 0701 - 11/28 0700 11/28 0701 - 11/29 0700   I.V. (mL/kg) 2936.4 (43.1) 105 (1.5)   IV Piggyback 1250    Total Intake(mL/kg) 4186.4 (61.5) 105 (1.5)   Urine (mL/kg/hr) 967 (0.6) 20 (0.1)   Total Output 967 20   Net +3219.4 +85        Urine Occurrence 1 x      PHYSICAL EXAMINATION: General:  Elderly, ill appearing on vent and sedation Neuro:  Sedate and drowsy but arousable HEENT:  NCAT, ET tube in mouth Cardiovascular:  Sinus tachycardia,  no s3 nor s4, no murmur/rub/gallop Lungs:  coarse sounds, no wheeze Abdomen:  (+)BS, soft, non distended Musculoskeletal:  Intact, appropriated strength, no edema, no lesion Skin:  Dry and warm to touch, no ulcer, no lesion  LABS:  CBC  Recent Labs Lab 03/28/13 1045 03/28/13 1200 03/29/13 0420  WBC 23.8* 29.7* 29.3*  HGB 12.6* 12.5* 11.4*  HCT 40.8 40.7 36.0*  PLT 531* 533* 426*   Coag's No results found for this basename: APTT, INR,  in the last 168 hours BMET  Recent Labs Lab 03/23/2013 1135 03/28/13 1045 03/29/13 0420  NA 137 139 137  K 4.3 4.2 4.0  CL 100 104 105  CO2 26 25 23  BUN 26* 19 26*  CREATININE 1.16 1.01 1.47*  GLUCOSE 134* 165* 138*   Electrolytes  Recent Labs Lab 03/24/2013 1135 03/28/13 1045 03/29/13 0420  CALCIUM 9.9 9.0 9.2  MG  --  1.6  --    Sepsis Markers  Recent Labs Lab 03/26/2013 1146 03/02/2013 1834 03/28/13 1045  LATICACIDVEN 0.97  --  1.5  PROCALCITON  --  <0.10  --    ABG  Recent Labs Lab 03/12/2013 1127 03/28/13 1039  PHART 7.418 7.310*  PCO2ART 39.0 47.3*  PO2ART 161.0* 127.0*   Liver Enzymes  Recent Labs Lab 03/04/2013 1135 03/28/13 1045 03/29/13 0420  AST 8 9 8   ALT 6 5  <5  ALKPHOS 127* 109 92  BILITOT 0.4 0.7 0.3  ALBUMIN 3.1* 2.7* 2.3*   Cardiac Enzymes  Recent Labs Lab 03/23/2013 1135  PROBNP 411.0   Glucose  Recent Labs Lab 03/28/13 0804 03/28/13 1315 03/28/13 1556 03/28/13 1958 03/29/13 0003 03/29/13 0438  GLUCAP 142* 138* 101* 120* 134* 101*    Imaging Ct Angio Chest Pe W/cm &/or Wo Cm  03/14/2013   CLINICAL DATA:  Shortness of breath, cough, lung cancer  EXAM: CT ANGIOGRAPHY CHEST WITH CONTRAST  TECHNIQUE: Multidetector CT imaging of the chest was performed using the standard protocol during bolus administration of intravenous contrast. Multiplanar CT image reconstructions including MIPs were obtained to evaluate the vascular anatomy.  CONTRAST:  OMNIPAQUE IOHEXOL 350 MG/ML SOLN  COMPARISON:  02/08/2013  FINDINGS: Again noted large left anterior mediastinal mass without change from prior exam.  The study is of excellent technical quality. No pulmonary embolus is noted. Trace pericardial effusion. Atherosclerotic calcifications of coronary arteries and thoracic aorta. Splenic artery calcifications. No adrenal gland mass is noted in visualized upper abdomen.  Sagittal images of the spine shows diffuse osteopenia and degenerative changes thoracic spine.  No destructive rib lesions are noted. There is elevation of the left hemidiaphragm. There is left lower lobe posterior consolidation with air bronchogram without change from prior exam. Again noted obstruction of left lower lobe airway. No precarinal or hilar adenopathy.  There is new obstruction probable due to secretions of right lower lobe airway as seen in image 45. There is new obstruction of right upper lobe lateral segment airway as seen image 54.  No aortic aneurysm.  Review of the MIP images confirms the above findings.  IMPRESSION: 1. No pulmonary embolus is noted. 2. Again noted large left anterior mediastinal mass. 3. Again noted consolidation with air bronchogram in left lower lobe  posteriorly. Again noted obstruction of left lower lobe airway. There is new obstruction of right lower lobe airway. Partial obstruction probable due to secretions or mucus plug of lateral segment right upper lobe airway. 4. Osteopenia and degenerative changes thoracic spine. No destructive bony lesions are noted.   Electronically Signed   By: Natasha Mead M.D.   On: 03/16/2013 14:00   Dg Chest Port 1 View  03/28/2013   CLINICAL DATA:  Left internal jugular line placement.  EXAM: PORTABLE CHEST - 1 VIEW  COMPARISON:  03/28/2013 9:20 a.m.  FINDINGS: Left internal jugular catheter tip at the level of the proximal superior vena cava directed laterally. This may partially tent the lateral wall of the superior vena cava. No gross pneumothorax.  Endotracheal tube tip 5 cm above the carina.  Left anterior mediastinal mass projects over the hilum.  Consolidation lung bases greater on the right.  Pulmonary vascular congestion.  Calcified slightly tortuous aorta.  Gas-filled  stomach.  IMPRESSION: Left internal jugular catheter tip at the level of the proximal superior vena cava directed laterally. This may partially tent the lateral wall of the superior vena cava. No gross pneumothorax.  Please see above.  This is a call report.   Electronically Signed   By: Bridgett Larsson M.D.   On: 03/28/2013 10:28   Dg Chest Port 1 View  03/28/2013   CLINICAL DATA:  Post bronchoscopy.  EXAM: PORTABLE CHEST - 1 VIEW  COMPARISON:  03/28/2013 chest x-ray, 03/21/2013 chest CT, 02/22/2013 PET-CT.  FINDINGS: Endotracheal tube tip 4.8 cm above the carina.  Gas-filled stomach. This may be related to endotracheal tube placement. Esophageal intubation not felt be of clinical concern.  Anterior mediastinal mass projects over the left hilum.  Consolidation lung bases greater on the right. Consolidation of the right lung base has progressed since the prior examination. This may represent result of pneumonia potentially aspiration pneumonia.  Atelectasis not excluded.  No gross pneumothorax.  Prominent skin fold on the right suspected.  Prominent right-sided nipple shadow.  Calcified aorta.  IMPRESSION: Endotracheal tube tip 4.8 cm above the carina.  Gas-filled stomach. This may be related to endotracheal tube placement. Esophageal intubation not felt be of clinical concern.  Anterior mediastinal mass projects over the left hilum.  Consolidation lung bases greater on the right. Consolidation of the right lung base has progressed since the prior examination. This may represent result of pneumonia potentially aspiration pneumonia. Atelectasis not excluded.  No gross pneumothorax.  Prominent skin fold on the right suspected.  These results were called by telephone at the time of interpretation on 03/28/2013 at 9:25 AM to Dr. Rory Percy , who verbally acknowledged these results.   Electronically Signed   By: Bridgett Larsson M.D.   On: 03/28/2013 09:30   Dg Chest Port 1 View  03/28/2013   CLINICAL DATA:  Difficulty breathing.  Lung cancer.  EXAM: PORTABLE CHEST - 1 VIEW  COMPARISON:  03/06/2013 and chest CT 03/29/2013  FINDINGS: Lungs are somewhat hypoinflated and demonstrate evidence of patient's known left-sided anterior mediastinal mass projected over the left hilar region. There is mild worsening bibasilar opacification which may be due to a combination of atelectasis and effusions. Cannot completely exclude infection. Remainder of the exam is unchanged.  IMPRESSION: Mild worsening bibasilar opacification which may be due to atelectasis/effusions although cannot exclude infection.  Known stable left anterior mediastinal mass a projected over the left hilar region.   Electronically Signed   By: Elberta Fortis M.D.   On: 03/28/2013 08:01   Dg Chest Portable 1 View  03/14/2013   CLINICAL DATA:  Shortness of breath, hypertension, diabetes, GERD, lung cancer, smoker, stroke  EXAM: PORTABLE CHEST - 1 VIEW  COMPARISON:  03/04/2013 CT chest  FINDINGS:  Normal heart size.  Atherosclerotic calcification aorta.  Left perihilar mass corresponding to anterior mediastinal mass identified on recent CT.  Left basilar atelectasis.  Remaining lungs clear.  No definite pleural effusion or pneumothorax.  Bones unremarkable.  IMPRESSION: Left perihilar mass corresponding to anterior mediastinal mass identified on recent CT. Left basilar atelectasis.   Electronically Signed   By: Ulyses Southward M.D.   On: 03/31/2013 11:56     CXR: 11/27: No pneumothorax, proper ET tube placement  No pneumothorax, proper left IJ line placement  Mass and infiltrate demonstrated   ASSESSMENT / PLAN:  PULMONARY A: Acute Respiratory Failure  --> CAP/PNA, Mediastinal Cancer Mass  --> Mucous plug and obstruction CAP Squamous  cell cancer of lung  P:   - Intubated, vent support, sedation with Fentanyl - 50% FiO2, RR 20, PEEP 10, 8 mL/Kg, goal to 8 successful on peep, if to 5 , then sbt planned -consider slight increase rate 22 - Sputum culture and cytology sent, pending result - Nebulized breathing treatments - PNA seems to be the main issue, no cancer obstruction on bronchoscopy - Planned RT to mediastinal cancer mass, will update rads - Family aware that RT is pallative -no ards, still , keep plat less 30   CARDIOVASCULAR A:  Hypertension by History  --> Currently hypotensive  --> Likely due to sedation but at risk for sepsis and shock P:  - Hold antihypertensive - allow pos balance further - Pressor supports with Levophed to MAP goal 60 - Likely drop in BP sedation and pos pressure related, bolus and re eval - CVP at 9, even to pos balance -cortisol now then stress empiric roids  RENAL A:   Hypotension AKI - current scr 1.47 and baseline at 1.1  --> Likely infectious driven, dehydration, and recent contrast used, atn  P:   - Net positive 5L since admission - Urine output low overninght - IVF at 75/hour, to 50 , cvp 9 - Levophed with adequate MAP and  CVP at 9  GASTROINTESTINAL A:   GERD/Reflux base on Medication History At risk for C. Diff  P:   - Pepcid - Monitor diarrhea  -Consider early feeding   HEMATOLOGIC A:   Leukocytosis Thrombocytosis  --> Likely reactive to PNA above P:  - Daily CBC - Lovenox for DVT prophylaxis in setting mech vent and cancer, crt to follow, may ned to dc with crt rise  INFECTIOUS A:   Pneumonia, concern was post obstructive, bronch NOT impressive for that cap  P:   - Currently on Vancomycin and Primaxin - Sputum culture//Cytology sent - Blood culture sent 11/26, pending  ENDOCRINE A:   T2DM  - Adequate control with A1c of 6 on admission   P:   - Insulin sliding scale - CBG  NEUROLOGIC A:   Depression - reactive to new cancer diagnosis Encephalopathy - secondary to sedation from being intubated History of CVA x2  P:   - Continue sedation for vent induced discomfort and agitation - Monitor neuro status daily - Daily WUA - Wean vent and sedation ASAP - Avoid Versed  I have had extensive discussions with family daughter We discussed patients current circumstances and organ failures. We also discussed patient's prior wishes under circumstances such as this. Family has decided to NOT perform resuscitation if arrest but to continue current medical support for now. Would not want prolonged support, reconsider comfort if still on vent monday    TODAY'S SUMMARY: improving vent needs, may sbt, goal to wean levophed  I have personally obtained a history, examined the patient, evaluated laboratory and imaging results, formulated the assessment and plan and placed orders. CRITICAL CARE: The patient is critically ill with multiple organ systems failure and requires high complexity decision making for assessment and support, frequent evaluation and titration of therapies, application of advanced monitoring technologies and extensive interpretation of multiple databases. Critical Care Time  devoted to patient care services described in this note is 35  minutes.    03/29/2013, 8:58 AM Mcarthur Rossetti. [name], MD, FACP Pgr: [phone] [facility]

## 2013-03-29 NOTE — Progress Notes (Signed)
ANTIBIOTIC CONSULT NOTE - Follow Up  Pharmacy Consult for Primaxin, Vancomycin Indication: post-obstructive PNA  No Known Allergies  Patient Measurements: Height: 6' (182.9 cm) Weight: 150 lb 2.1 oz (68.1 kg) IBW/kg (Calculated) : 77.6  Vital Signs: Temp: 99.1 F (37.3 C) (11/28 1530) Temp src: Axillary (11/28 1530) BP: 104/52 mmHg (11/28 1530) Pulse Rate: 93 (11/28 1530) Intake/Output from previous day: 11/27 0701 - 11/28 0700 In: 4186.4 [I.V.:2936.4; IV Piggyback:1250] Out: 967 [Urine:967] Intake/Output from this shift: Total I/O In: 1002.5 [I.V.:782.5; NG/GT:120; IV Piggyback:100] Out: 190 [Urine:190]  Labs:  Recent Labs  24-Apr-2013 1135 03/28/13 1045 03/28/13 1200 03/29/13 0420  WBC 18.2* 23.8* 29.7* 29.3*  HGB 13.0 12.6* 12.5* 11.4*  PLT 431* 531* 533* 426*  CREATININE 1.16 1.01  --  1.47*   Estimated Creatinine Clearance: 39.9 ml/min (by C-G formula based on Cr of 1.47).  Recent Labs  03/29/13 1548  VANCOTROUGH 10.7     Microbiology: Recent Results (from the past 720 hour(s))  MRSA PCR SCREENING     Status: None   Collection Time    04/24/13  5:15 PM      Result Value Range Status   MRSA by PCR NEGATIVE  NEGATIVE Final   Comment:            The GeneXpert MRSA Assay (FDA     approved for NASAL specimens     only), is one component of a     comprehensive MRSA colonization     surveillance program. It is not     intended to diagnose MRSA     infection nor to guide or     monitor treatment for     MRSA infections.  CULTURE, BLOOD (ROUTINE X 2)     Status: None   Collection Time    Apr 24, 2013  7:20 PM      Result Value Range Status   Specimen Description BLOOD RIGHT ARM   Final   Special Requests BOTTLES DRAWN AEROBIC AND ANAEROBIC 5CC   Final   Culture  Setup Time     Final   Value: Apr 24, 2013 22:24     Performed at [facility]   Culture     Final   Value:        BLOOD CULTURE RECEIVED NO GROWTH TO DATE CULTURE WILL BE HELD FOR 5 DAYS  BEFORE ISSUING A FINAL NEGATIVE REPORT     Performed at [facility]   Report Status PENDING   Incomplete  CULTURE, BLOOD (ROUTINE X 2)     Status: None   Collection Time    04/24/13  7:32 PM      Result Value Range Status   Specimen Description BLOOD LEFT HAND   Final   Special Requests BOTTLES DRAWN AEROBIC ONLY 3CC   Final   Culture  Setup Time     Final   Value: April 24, 2013 22:24     Performed at [facility]   Culture     Final   Value:        BLOOD CULTURE RECEIVED NO GROWTH TO DATE CULTURE WILL BE HELD FOR 5 DAYS BEFORE ISSUING A FINAL NEGATIVE REPORT     Performed at [facility]   Report Status PENDING   Incomplete  CULTURE, RESPIRATORY (NON-EXPECTORATED)     Status: None   Collection Time    03/28/13 12:27 PM      Result Value Range Status   Specimen Description TRACHEAL ASPIRATE   Final  Special Requests NONE   Final   Gram Stain PENDING   Incomplete   Culture     Final   Value: NO GROWTH 1 DAY     Performed at [facility]   Report Status PENDING   Incomplete   Medical History: Past Medical History  Diagnosis Date  . Right inguinal hernia october 2012  . Diabetes mellitus   . Hypertension   . Glaucoma   . Colon polyps   . Osteoporosis     spine  . GERD (gastroesophageal reflux disease)   . Hand eczema   . Hyperlipidemia   . Aphasia   . Depression   . BPH (benign prostatic hyperplasia)   . Bladder stones 10/2010  . Lung cancer 03/04/13    left  . Anemia   . 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 Complication of anesthesia     hard to wake up  . PONV (postoperative nausea and vomiting)    Medications:  Scheduled:  . antiseptic oral rinse  15 mL Mouth Rinse QID  . chlorhexidine  15 mL Mouth Rinse BID  . docusate sodium  100 mg Oral QHS  . famotidine  20 mg Oral BID  . heparin subcutaneous  5,000 Units Subcutaneous Q8H  . hydrocortisone sodium succinate  50 mg Intravenous Q6H  . imipenem-cilastatin   250 mg Intravenous Q6H  . insulin aspart  0-9 Units Subcutaneous Q4H  . latanoprost  1 drop Both Eyes QHS  . levalbuterol  0.63 mg Nebulization Q8H  . PARoxetine  40 mg Oral QHS  . simvastatin  10 mg Oral QHS  . vancomycin  500 mg Intravenous Q12H   Assessment: 49 yoM with recently diagnosed SQ cell Lung Ca, no chemotherapy desired, planned XRT. Admit for sx of PNA, bronchoscopy today with complete RLL & LLL collapse with mucus plugging, BAL obtained. Vancomycin and Cefepime started 11/26, d/c Cefepime, begin Primaxin 11/27  Day # 3/8 Vancomycin, Day #2 Primaxin  Vanc trough subtherapeutic despite rise in SCr  WBC elevated and unchanged  Afebrile  Goal of Therapy:  Abx dose/schedule appropriate for indication and renal function.  Vanc Trough 15-20 mcg/mL  Plan:  1) Change vancomycin from 500mg  IV q12 to 750mg  IV q12 2) Continue current Primaxin dosing   Hessie Knows, PharmD, BCPS Pager [phone] 03/29/2013 4:38 PM

## 2013-03-29 NOTE — Clinical Documentation Improvement (Signed)
Pt w/ Respiratory failure, on vent and with PNA  Clarification Needed   Please clarify if the PNA, Respiratory failure in setting of mech vent can be further specified as one of the diagnoses listed below and document in pn or d/c summary.   Possible Clinical Conditions?  Septicemia / Sepsis Severe Sepsis Neutropenic Sepsis  SIRS Septic Shock Sepsis with UTI Sepsis due to an internal device  Bacterial infection of unknown etiology / source Other Condition  Cannot clinically Determine   Risk Factors:  Acute Resp Failure/vent  ATN  PNA  HTN  Lung ca  DMII  Diagnostics BPs 95/42 78/42 85/52  85/52 96/52 87/50  68/37  Component      WBC  Latest Ref Rng      4.0 - 10.5 K/uL  03/18/2013     11:35 AM 18.2 (H)   Component      WBC  Latest Ref Rng      4.0 - 10.5 K/uL  03/28/2013     10:45 AM 23.8 (H)  03/28/2013     12:00 PM 29.7 (H)   Treatments: Vancomycin and Primaxin    sodium chloride 0.9 % bolus 500 mL      norepinephrine (LEVOPHED) 4 mg in dextrose 5 % 250 mL infusion     Thank You, Enis Slipper ,RN Clinical Documentation Specialist:  [phone]  [facility]- Health Information Management

## 2013-03-29 NOTE — Progress Notes (Addendum)
INITIAL NUTRITION ASSESSMENT  Pt meets criteria for severe MALNUTRITION in the context of chronic illness as evidenced by <75% estimated energy intake with 8.5% weight loss in the past month.  DOCUMENTATION CODES Per approved criteria  -Severe malnutrition in the context of chronic illness   INTERVENTION: - Initiate TF of Osmolite 1.5 start at 40ml/hr increase by 10ml every 4 hours to goal of 12ml/hr. Goal rate will provide 1980 calories, 83g protein, free water and meet 109% estimated calorie needs, 100% estimated protein needs. If IVF d/c, recommend water flushes 6 times/day.  - Initiate adult enteral protocol  - Will continue to monitor   NUTRITION DIAGNOSIS: Inadequate oral intake related to inability to eat as evidenced by NPO, mechanical ventilation.   Goal: Initiation of TF with goal to meet >90% of estimated nutritional needs  Monitor:  Weights, labs, vent status, TF initiation  Reason for Assessment: Ventilated, malnutrition screening tool, consult for TF management  79 y.o. male  Admitting Dx: HCAP (healthcare-associated pneumonia)  ASSESSMENT: Pt with hx CVA 2000/2002, type 2 DM, HTN, HLD, glaucoma, BPH S/P TURP. Squamous Cell Lung Cancer diagnosed by biopsy on 03/04/2013. On 03/19/2013 patient saw Dr. Dorothy Puffer (radiation oncologist) and per note it was decided patient would undergo XRT treatment for approximately 2-5-1/2 week course of treatment. Admitted with HCAP (healthcare-associated pneumonia). Intubated yesterday for respiratory insufficiency.   Daughter present at bedside. She reports pt has lost 14 pounds unintentionally since CA dx in October 2014. Has been eating only 1-2 meals/day and does not drink any nutritional supplements and is a picky eater.   Patient is currently intubated on ventilator support.  MV: 12.8 L/min Temp:Temp (24hrs), Avg:98.8 F (37.1 C), Min:97.8 F (36.6 C), Max:99.3 F (37.4 C)  Propofol: not running      Height: Ht Readings from Last 1 Encounters:  03/20/2013 6' (1.829 m)    Weight: Wt Readings from Last 1 Encounters:  03/29/13 150 lb 2.1 oz (68.1 kg)    Ideal Body Weight: 178 lb  % Ideal Body Weight: 84%  Wt Readings from Last 10 Encounters:  03/29/13 150 lb 2.1 oz (68.1 kg)  03/22/13 147 lb 1.6 oz (66.724 kg)  03/14/13 152 lb 1.6 oz (68.992 kg)  03/04/13 165 lb (74.844 kg)  03/23/11 186 lb 4 oz (84.482 kg)  02/16/11 192 lb 2 oz (87.147 kg)    Usual Body Weight: 164 lb per daughter  % Usual Body Weight: 91%  BMI:  Body mass index is 20.36 kg/(m^2).  Estimated Nutritional Needs: Kcal: 1817 Protein: 80-100g Fluid: 1.8L/day  Skin: Deep tissue injury on sacrum  Diet Order: NPO  EDUCATION NEEDS: -No education needs identified at this time   Intake/Output Summary (Last 24 hours) at 03/29/13 1000 Last data filed at 03/29/13 0900  Gross per 24 hour  Intake 4021.35 ml  Output   1017 ml  Net 3004.35 ml    Last BM: 11/27  Labs:   Recent Labs Lab 03/17/2013 1135 03/28/13 1045 03/29/13 0420  NA 137 139 137  K 4.3 4.2 4.0  CL 100 104 105  CO2 26 25 23   BUN 26* 19 26*  CREATININE 1.16 1.01 1.47*  CALCIUM 9.9 9.0 9.2  MG  --  1.6  --   GLUCOSE 134* 165* 138*    CBG (last 3)   Recent Labs  03/28/13 1958 03/29/13 0003 03/29/13 0438  GLUCAP 120* 134* 101*    Scheduled Meds: . antiseptic oral rinse  15 mL  Mouth Rinse QID  . chlorhexidine  15 mL Mouth Rinse BID  . dextromethorphan-guaiFENesin  1 tablet Oral BID  . docusate sodium  100 mg Oral QHS  . enoxaparin (LOVENOX) injection  40 mg Subcutaneous Q24H  . famotidine  20 mg Oral BID  . imipenem-cilastatin  250 mg Intravenous Q6H  . insulin aspart  0-9 Units Subcutaneous Q4H  . latanoprost  1 drop Both Eyes QHS  . levalbuterol  0.63 mg Nebulization Q8H  . midazolam      . PARoxetine  40 mg Oral QHS  . senna   Oral Daily  . simvastatin  10 mg Oral QHS  . vancomycin  500 mg Intravenous  Q12H    Continuous Infusions: . sodium chloride 1,000 mL (03/29/13 0035)  . fentaNYL infusion INTRAVENOUS 150 mcg/hr (03/29/13 0009)  . norepinephrine (LEVOPHED) Adult infusion 10 mcg/min (03/29/13 0900)    Past Medical History  Diagnosis Date  . Right inguinal hernia october 2012  . Diabetes mellitus   . Hypertension   . Glaucoma   . Colon polyps   . Osteoporosis     spine  . GERD (gastroesophageal reflux disease)   . Hand eczema   . Hyperlipidemia   . Aphasia   . Depression   . BPH (benign prostatic hyperplasia)   . Bladder stones 10/2010  . Lung cancer 03/04/13    left  . Anemia   . 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 Complication of anesthesia     hard to wake up  . PONV (postoperative nausea and vomiting)     Past Surgical History  Procedure Laterality Date  . Transurethral resection of prostate  June 2012    Removal of bladder stone  . Hip arthroplasty  April 2012    Dr. Shelle Iron  . Hernia repair  03/04/11    lap bilateral inguinal hernia repair   . Lung biopsy  03/04/13    squamous cell ca/mediatinum    Levon Hedger MS, RD, LDN [phone] Pager [phone] After Hours Pager

## 2013-03-30 ENCOUNTER — Inpatient Hospital Stay (HOSPITAL_COMMUNITY): Payer: Medicare Other

## 2013-03-30 LAB — COMPREHENSIVE METABOLIC PANEL
ALT: 9 U/L (ref 0–53)
AST: 18 U/L (ref 0–37)
Albumin: 2.3 g/dL — ABNORMAL LOW (ref 3.5–5.2)
CO2: 22 mEq/L (ref 19–32)
Calcium: 9.4 mg/dL (ref 8.4–10.5)
GFR calc Af Amer: 62 mL/min — ABNORMAL LOW (ref >90)
GFR calc non Af Amer: 53 mL/min — ABNORMAL LOW (ref >90)
Glucose, Bld: 129 mg/dL — ABNORMAL HIGH (ref 70–99)
Potassium: 3.9 mEq/L (ref 3.5–5.1)
Sodium: 140 mEq/L (ref 135–145)
Total Protein: 5.9 g/dL — ABNORMAL LOW (ref 6.0–8.3)

## 2013-03-30 LAB — GLUCOSE, CAPILLARY
Glucose-Capillary: 123 mg/dL — ABNORMAL HIGH (ref 70–99)
Glucose-Capillary: 114 mg/dL — ABNORMAL HIGH (ref 70–99)
Glucose-Capillary: 114 mg/dL — ABNORMAL HIGH (ref 70–99)
Glucose-Capillary: 102 mg/dL — ABNORMAL HIGH (ref 70–99)

## 2013-03-30 LAB — CBC WITH DIFFERENTIAL/PLATELET
Basophils Absolute: 0 10*3/uL (ref 0.0–0.1)
Basophils Relative: 0 % (ref 0–1)
Eosinophils Absolute: 0 10*3/uL (ref 0.0–0.7)
Eosinophils Relative: 0 % (ref 0–5)
Hemoglobin: 10.6 g/dL — ABNORMAL LOW (ref 13.0–17.0)
Lymphocytes Relative: 5 % — ABNORMAL LOW (ref 12–46)
MCHC: 30.8 g/dL (ref 30.0–36.0)
MCV: 90.1 fL (ref 78.0–100.0)
Platelets: 343 10*3/uL (ref 150–400)
RDW: 15.4 % (ref 11.5–15.5)
WBC: 24.4 10*3/uL — ABNORMAL HIGH (ref 4.0–10.5)

## 2013-03-30 LAB — BLOOD GAS, ARTERIAL
Bicarbonate: 21.9 mEq/L (ref 20.0–24.0)
MECHVT: 620 mL
O2 Saturation: 97.2 %
PEEP: 5 cmH2O
Patient temperature: 37
RATE: 22 resp/min
pCO2 arterial: 38.5 mmHg (ref 35.0–45.0)
pH, Arterial: 7.373 (ref 7.350–7.450)

## 2013-03-30 MED ORDER — HALOPERIDOL LACTATE 5 MG/ML IJ SOLN: 1.0000 mg | INTRAMUSCULAR | Status: DC | PRN

## 2013-03-30 MED ORDER — PANTOPRAZOLE SODIUM 40 MG IV SOLR
40.0000 mg | Freq: Every day | INTRAVENOUS | Status: DC
  Administered 2013-03-30: 40 mg via INTRAVENOUS
  Filled 2013-03-30 (×2): qty 40

## 2013-03-30 MED ORDER — MORPHINE SULFATE 25 MG/ML IV SOLN
1.0000 mg/h | INTRAVENOUS | Status: DC
  Filled 2013-03-30: qty 10

## 2013-03-30 MED ORDER — SODIUM CHLORIDE 0.9 % IV SOLN
1.0000 mg/h | INTRAVENOUS | Status: DC
  Administered 2013-03-30: 1 mg/h via INTRAVENOUS
  Administered 2013-03-31: 4 mg/h via INTRAVENOUS
  Administered 2013-04-01: 5 mg/h via INTRAVENOUS
  Filled 2013-03-30 (×4): qty 10

## 2013-03-30 MED ORDER — FENTANYL CITRATE 0.05 MG/ML IJ SOLN: 25.0000 ug | INTRAMUSCULAR | Status: DC | PRN

## 2013-03-30 MED ORDER — METOPROLOL TARTRATE 1 MG/ML IV SOLN
2.5000 mg | INTRAVENOUS | Status: DC | PRN
  Administered 2013-03-30 (×2): 2.5 mg via INTRAVENOUS
  Filled 2013-03-30 (×2): qty 5

## 2013-03-30 MED ORDER — HYDROCORTISONE SOD SUCCINATE 100 MG IJ SOLR
50.0000 mg | Freq: Two times a day (BID) | INTRAMUSCULAR | Status: DC
  Administered 2013-03-30: 50 mg via INTRAVENOUS
  Filled 2013-03-30 (×2): qty 1

## 2013-03-30 MED ORDER — HALOPERIDOL LACTATE 5 MG/ML IJ SOLN
INTRAMUSCULAR | Status: AC
  Filled 2013-03-30: qty 1

## 2013-03-30 MED ORDER — SODIUM CHLORIDE 0.9 % IV BOLUS (SEPSIS)
500.0000 mL | Freq: Once | INTRAVENOUS | Status: AC
  Administered 2013-03-30: 500 mL via INTRAVENOUS

## 2013-03-30 MED ORDER — FENTANYL CITRATE 0.05 MG/ML IJ SOLN
INTRAMUSCULAR | Status: AC
  Administered 2013-03-30: 50 ug
  Filled 2013-03-30: qty 2

## 2013-03-30 MED ORDER — HALOPERIDOL LACTATE 5 MG/ML IJ SOLN
1.0000 mg | INTRAMUSCULAR | Status: DC
  Administered 2013-03-30: 4 mg via INTRAVENOUS

## 2013-03-30 NOTE — Progress Notes (Addendum)
Wasted 30 ml  of 250 ml bag of Fentanyl concentration 48mcg/ml. Wasted in sink.  Zeba Luby Debroah Loop RN witness.

## 2013-03-30 NOTE — Progress Notes (Signed)
eLink Physician-Brief Progress Note Patient Name: [name] DOB: 25-Sep-1932 MRN: [id number]  Date of Service  03/30/2013   HPI/Events of Note   Pt. Self-extubated. Likely to fail.  eICU Interventions   Spoke with son, Leonette Most [phone]), and daughter about self-extubation and impending respiratory failure. They are leaning towards focusing on comfort but want to come in and see their father before making any decisions. They are coming in now. Our RNs believe that he is likely to hold his own until they arrive. If the situation deteriorates prior to their arrival I will call them again and we will resolve over the phone.   Intervention Category Major Interventions: End of life / care limitation discussion  Jinnie Onley R. 03/30/2013, 6:17 AM

## 2013-03-30 NOTE — Progress Notes (Signed)
Came out of room 1234 and heard pt in 1232 cough. Found pt with ETT and OG tube out of place. Called Charge nurse and Respiratory to assist. Placed pt on NRB and contacted Elink. Suctioned airway and monitored pt closely while Dr talked with family about wishes. Family wish to come and evaluate situation.

## 2013-03-30 NOTE — Progress Notes (Signed)
PULMONARY  / CRITICAL CARE MEDICINE  Name: [name] MRN: [id number] DOB: 1932/07/26    ADMISSION DATE:  03/21/2013 CONSULTATION DATE:  03/28/2013 at [facility] MD :  Dr. Joseph Art PRIMARY SERVICE: PCCM  CHIEF COMPLAINT:  Acute Respiratory Failure, Squamous Cell  Cancer of Lung, Airway Support  BRIEF PATIENT DESCRIPTION: 79 year old caucasian male  With CVA 2000/2002, T2DM, HTN, HLD, Glaucoma, BPH s/p TURP, Recently Diagnosed Squamous Cell Lung Caner TTF-1 with Negative Cytokeratin on Biposy 03/04/13 admitted for PNA 11/26.  His respiratory status worsened overnight requiring 100%FiO2 on BiPAP for adequate saturation.  PCCM was consulted to resume his care in light of acute/rapidly respiratory failure.  His respiratory failure was likely secondary to mucous plugging causing by cancer mass causing extrinsic compression.  He was intubated, vented, and underwent bronchoscopy for washing.  SIGNIFICANT EVENTS / STUDIES:  11/26: CTA with large left anterior mediastinal mass without interval changes from previous  Consolidation of LLL  New obstruction of RLL  Negative for PE 11/27:  Underwent endotracheal intubation  Underwent bronchoscopy with washing and eval left lower lobe  LINES / TUBES: ET Tube 11/27 >>>11/29 (self ext) Left IJ CVL 11/27 >>> Peripheral IVs 11/26 >>> Foley 11/26 >>>  CULTURES: Sputum culture 11/27 >>> Blood culture 11/26 >>>ng  ANTIBIOTICS: Vancomycin 11/26 >>> Primaxin 11/26 >>> Cefepime 11/26 >>> 11/27  SUBJECTIVE:  Self extubated early am WOB ok on NRB Remains on fent gtt  VITAL SIGNS: Temp:  [98 F (36.7 C)-99.5 F (37.5 C)] 98.1 F (36.7 C) (11/29 0800) Pulse Rate:  [53-129] 109 (11/29 0600) Resp:  [16-27] 18 (11/29 0600) BP: (78-141)/(39-83) 129/69 mmHg (11/29 0600) SpO2:  [67 %-100 %] 100 % (11/29 0600) FiO2 (%):  [40 %-50 %] 40 % (11/29 0439) Weight:  [70.4 kg (155 lb 3.3 oz)] 70.4 kg (155 lb 3.3 oz) (11/29 0400) HEMODYNAMICS: CVP:   [9 mmHg] 9 mmHg VENTILATOR SETTINGS: Vent Mode:  [-] PRVC FiO2 (%):  [40 %-50 %] 40 % Set Rate:  [20 bmp-22 bmp] 22 bmp Vt Set:  [620 mL] 620 mL PEEP:  [5 cmH20-8 cmH20] 5 cmH20 Plateau Pressure:  [17 cmH20-20 cmH20] 18 cmH20 INTAKE / OUTPUT: Intake/Output     11/28 0701 - 11/29 0700 11/29 0701 - 11/30 0700   I.V. (mL/kg) 1665 (23.7)    NG/GT 710    IV Piggyback 450    Total Intake(mL/kg) 2825 (40.1)    Urine (mL/kg/hr) 595 (0.4)    Total Output 595     Net +2230            PHYSICAL EXAMINATION: General:  Elderly,chronically ill appearing onsedation Neuro:  Sedate and drowsy but arousable, not firectible HEENT:  NCAT,  Cardiovascular:  Sinus tachycardia,  no s3 nor s4, no murmur/rub/gallop Lungs:  coarse sounds, no wheeze, weak cough Abdomen:  (+)BS, soft, non distended Musculoskeletal:  Intact, appropriated strength, no edema, no lesion Skin:  Dry and warm to touch, no ulcer, no lesion  LABS:  CBC  Recent Labs Lab 03/28/13 1200 03/29/13 0420 03/30/13 0615  WBC 29.7* 29.3* 24.4*  HGB 12.5* 11.4* 10.6*  HCT 40.7 36.0* 34.4*  PLT 533* 426* 343   Coag's No results found for this basename: APTT, INR,  in the last 168 hours BMET  Recent Labs Lab 03/28/13 1045 03/29/13 0420 03/30/13 0615  NA 139 137 140  K 4.2 4.0 3.9  CL 104 105 108  CO2 25 23 22   BUN 19 26* 31*  CREATININE 1.01 1.47* 1.25  GLUCOSE 165* 138* 129*   Electrolytes  Recent Labs Lab 03/28/13 1045 03/29/13 0420 03/30/13 0615  CALCIUM 9.0 9.2 9.4  MG 1.6  --   --    Sepsis Markers  Recent Labs Lab 03/26/2013 1146 03/12/2013 1834 03/28/13 1045  LATICACIDVEN 0.97  --  1.5  PROCALCITON  --  <0.10  --    ABG  Recent Labs Lab 03/30/2013 1127 03/28/13 1039 03/30/13 0442  PHART 7.418 7.310* 7.373  PCO2ART 39.0 47.3* 38.5  PO2ART 161.0* 127.0* 95.6   Liver Enzymes  Recent Labs Lab 03/28/13 1045 03/29/13 0420 03/30/13 0615  AST 9 8 18   ALT 5 <5 9  ALKPHOS 109 92 97  BILITOT  0.7 0.3 0.2*  ALBUMIN 2.7* 2.3* 2.3*   Cardiac Enzymes  Recent Labs Lab 03/17/2013 1135  PROBNP 411.0   Glucose  Recent Labs Lab 03/29/13 1142 03/29/13 1548 03/29/13 1923 03/29/13 2330 03/30/13 0356 03/30/13 0802  GLUCAP 90 95 139* 143* 123* 114*    Imaging Dg Chest Port 1 View  03/30/2013   CLINICAL DATA:  Intubated.  Lung carcinoma.  EXAM: PORTABLE CHEST - 1 VIEW  COMPARISON:  03/28/2013  FINDINGS: Endotracheal tube 4.7 cm above carina. Left IJ central line to the proximal SVC. A nasogastric tube to the stomach. Improved right lower lobe aeration. Persistent left hilar mass. Left retrocardiac consolidation/ atelectasis stable.  IMPRESSION: 1. Improved right lower lobe aeration since prior study   Electronically Signed   By: Oley Balm M.D.   On: 03/30/2013 07:21   Dg Chest Port 1 View  03/28/2013   CLINICAL DATA:  Left internal jugular line placement.  EXAM: PORTABLE CHEST - 1 VIEW  COMPARISON:  03/28/2013 9:20 a.m.  FINDINGS: Left internal jugular catheter tip at the level of the proximal superior vena cava directed laterally. This may partially tent the lateral wall of the superior vena cava. No gross pneumothorax.  Endotracheal tube tip 5 cm above the carina.  Left anterior mediastinal mass projects over the hilum.  Consolidation lung bases greater on the right.  Pulmonary vascular congestion.  Calcified slightly tortuous aorta.  Gas-filled stomach.  IMPRESSION: Left internal jugular catheter tip at the level of the proximal superior vena cava directed laterally. This may partially tent the lateral wall of the superior vena cava. No gross pneumothorax.  Please see above.  This is a call report.   Electronically Signed   By: Bridgett Larsson M.D.   On: 03/28/2013 10:28   Dg Chest Port 1 View  03/28/2013   CLINICAL DATA:  Post bronchoscopy.  EXAM: PORTABLE CHEST - 1 VIEW  COMPARISON:  03/28/2013 chest x-ray, 03/23/2013 chest CT, 02/22/2013 PET-CT.  FINDINGS: Endotracheal tube tip  4.8 cm above the carina.  Gas-filled stomach. This may be related to endotracheal tube placement. Esophageal intubation not felt be of clinical concern.  Anterior mediastinal mass projects over the left hilum.  Consolidation lung bases greater on the right. Consolidation of the right lung base has progressed since the prior examination. This may represent result of pneumonia potentially aspiration pneumonia. Atelectasis not excluded.  No gross pneumothorax.  Prominent skin fold on the right suspected.  Prominent right-sided nipple shadow.  Calcified aorta.  IMPRESSION: Endotracheal tube tip 4.8 cm above the carina.  Gas-filled stomach. This may be related to endotracheal tube placement. Esophageal intubation not felt be of clinical concern.  Anterior mediastinal mass projects over the left hilum.  Consolidation lung bases greater on the  right. Consolidation of the right lung base has progressed since the prior examination. This may represent result of pneumonia potentially aspiration pneumonia. Atelectasis not excluded.  No gross pneumothorax.  Prominent skin fold on the right suspected.  These results were called by telephone at the time of interpretation on 03/28/2013 at 9:25 AM to Dr. Rory Percy , who verbally acknowledged these results.   Electronically Signed   By: Bridgett Larsson M.D.   On: 03/28/2013 09:30     CXR: 11/27: No pneumothorax, proper ET tube placement  No pneumothorax, proper left IJ line placement  Mass and infiltrate demonstrated   ASSESSMENT / PLAN:  PULMONARY A: Acute Respiratory Failure  --> CAP/PNA, Mediastinal Cancer Mass  --> Mucous plug and obstruction CAP Squamous cell cancer of lung-no cancer obstruction on bronchoscopy  P:   -Discussed with family- secretion clearance main issue, but NO reintubation - Nebulized breathing treatments - Planned RT to mediastinal cancer mass Mitzi Hansen) - Family aware that RT is pallative   CARDIOVASCULAR A:  Hypertension by  History  --> Currently hypotensive  --> Likely due to sedation but at risk for sepsis and shock AF-RVR P:  - Hold antihypertensive - Off Levophed - DO not restart - - CVP at 9, ct pos balance -rate control with metoprolol if BP permits   RENAL A:   Hypotension AKI - current scr 1.47 and baseline at 1.1  --> Likely infectious driven, dehydration, and recent contrast used, atn  P:   -  IVF at 50   GASTROINTESTINAL A:   GERD/Reflux base on Medication History At risk for C. Diff  P:   - IV Pepcid - Monitor diarrhea   HEMATOLOGIC A:   Leukocytosis Thrombocytosis  --> Likely reactive to PNA above P:  - Daily CBC - Lovenox   INFECTIOUS A:   Pneumonia,  bronch NOT  post obstructive cap  P:   - Currently on Vancomycin and Primaxin   ENDOCRINE A:   T2DM  - Adequate control with A1c of 6 on admission   P:   - Insulin sliding scale - CBG  NEUROLOGIC A:   Depression - reactive to new cancer diagnosis Encephalopathy - secondary to sedation from being intubated History of CVA x2 H/o narcotic use for chronic neck pain  P:   - Continue fent prn, dc gtt  Summary -I have had extensive discussion with daughter [name] & son [name]. We discussed patients current circumstances and poor baseline. We also discussed patient's prior wishes under circumstances such as this. Family has decided to NOT to reintubate or perform resuscitation if arrest but to continue current medical support for now. Full comfort care if he worsens or appears to be drowning in secretions    I have personally obtained a history, examined the patient, evaluated laboratory and imaging results, formulated the assessment and plan and placed orders. CRITICAL CARE: The patient is critically ill with multiple organ systems failure and requires high complexity decision making for assessment and support, frequent evaluation and titration of therapies, application of advanced monitoring technologies and  extensive interpretation of multiple databases. Critical Care Time devoted to patient care services described in this note is 35  minutes.    03/30/2013, 9:03 AM Cyril Mourning MD. FCCP. [facility] Pager [phone] If no response call [phone]

## 2013-03-30 NOTE — ED Provider Notes (Signed)
Medical screening examination/treatment/procedure(s) were conducted as a shared visit with non-physician practitioner(s) and myself.  I personally evaluated the patient during the encounter.  Recent diagnosis of lung CA, getting radiation with worsening SOB since yesterday.  No chest pain. Increased WOB on arrival with rhonchi and tachypnea. CXR unchanged. No CO2 retention on ABG. No PE. Likely has HCAP and postobstructive pneumonia.  O2 weaned to nasal cannula in ED.  EKG Interpretation    Date/Time:  Wednesday March 27 2013 11:11:30 EST Ventricular Rate:  111 PR Interval:  143 QRS Duration: 97 QT Interval:  339 QTC Calculation: 461 R Axis:   81 Text Interpretation:  Sinus tachycardia Atrial premature complex Borderline right axis deviation Borderline T abnormalities, inferior leads ST depressions anteriorly Nonspecific ST and T wave abnormality Confirmed by Manus Gunning  MD, Andrika Peraza ([id number]) on 03/05/2013 11:25:47 AM           CRITICAL CARE Performed by: Glynn Octave Total critical care time:30 Critical care time was exclusive of separately billable procedures and treating other patients. Critical care was necessary to treat or prevent imminent or life-threatening deterioration. Critical care was time spent personally by me on the following activities: development of treatment plan with patient and/or surrogate as well as nursing, discussions with consultants, evaluation of patient's response to treatment, examination of patient, obtaining history from patient or surrogate, ordering and performing treatments and interventions, ordering and review of laboratory studies, ordering and review of radiographic studies, pulse oximetry and re-evaluation of patient's condition.   Glynn Octave, MD 03/30/13 [phone]

## 2013-03-31 DIAGNOSIS — Z515 Encounter for palliative care: Secondary | ICD-10-CM

## 2013-03-31 LAB — CULTURE, RESPIRATORY W GRAM STAIN

## 2013-03-31 MED ORDER — BIOTENE DRY MOUTH MT LIQD: 15.0000 mL | Freq: Two times a day (BID) | OROMUCOSAL | Status: DC

## 2013-03-31 MED ORDER — MORPHINE SULFATE 2 MG/ML IJ SOLN: 2.0000 mg | INTRAMUSCULAR | Status: DC | PRN

## 2013-03-31 MED ORDER — CHLORHEXIDINE GLUCONATE 0.12 % MT SOLN
15.0000 mL | Freq: Two times a day (BID) | OROMUCOSAL | Status: DC
  Administered 2013-04-01: 15 mL via OROMUCOSAL
  Filled 2013-03-31 (×4): qty 15

## 2013-03-31 MED ORDER — LEVALBUTEROL HCL 0.63 MG/3ML IN NEBU: 0.6300 mg | INHALATION_SOLUTION | Freq: Four times a day (QID) | RESPIRATORY_TRACT | Status: DC | PRN

## 2013-03-31 NOTE — Progress Notes (Signed)
PULMONARY  / CRITICAL CARE MEDICINE  Name: ANCHOR DWAN MRN: [id number] DOB: [birth year]    ADMISSION DATE:  04/05/13 CONSULTATION DATE:  03/28/2013 at [facility] MD :  Dr. Joseph Art PRIMARY SERVICE: PCCM  CHIEF COMPLAINT:  Acute Respiratory Failure, Squamous Cell  Cancer of Lung, Airway Support  BRIEF PATIENT DESCRIPTION: 79 year old caucasian male  With CVA 2000/2002, T2DM, HTN, HLD, Glaucoma, BPH s/p TURP, Recently Diagnosed Squamous Cell Lung Caner TTF-1 with Negative Cytokeratin on Biposy 03/04/13 admitted for PNA 11/26.  His respiratory status worsened overnight requiring 100%FiO2 on BiPAP for adequate saturation.  PCCM was consulted to resume his care in light of acute/rapidly respiratory failure.  His respiratory failure was likely secondary to mucous plugging causing by cancer mass causing extrinsic compression.  He was intubated, vented, and underwent bronchoscopy for washing.  SIGNIFICANT EVENTS / STUDIES:  11/26: CTA with large left anterior mediastinal mass without interval changes from previous  Consolidation of LLL  New obstruction of RLL  Negative for PE 11/27:  Underwent endotracheal intubation  Underwent bronchoscopy with washing and eval left lower lobe  LINES / TUBES: ET Tube 11/27 >>>11/29 (self ext) Left IJ CVL 11/27 >>> Peripheral IVs 11/26 >>> Foley 11/26 >>>  CULTURES: Sputum culture 11/27 >>>ng Blood culture 11/26 >>>ng  ANTIBIOTICS: Vancomycin 11/26 >>> Primaxin 11/26 >>> Cefepime 11/26 >>> 11/27  SUBJECTIVE:  Self extubated 11/29 Transitioned to full comfort Required NTs On morphine gtt  VITAL SIGNS: Temp:  [96.5 F (35.8 C)-98 F (36.7 C)] 97.3 F (36.3 C) (11/30 0400) Pulse Rate:  [45-151] 106 (11/30 1000) Resp:  [5-17] 5 (11/30 1000) BP: (69-105)/(33-61) 85/46 mmHg (11/30 1000) SpO2:  [95 %-100 %] 100 % (11/30 1000) Weight:  [69.3 kg (152 lb 12.5 oz)] 69.3 kg (152 lb 12.5 oz) (11/30 0400) HEMODYNAMICS:   VENTILATOR SETTINGS:    INTAKE / OUTPUT: Intake/Output     11/29 0701 - 11/30 0700 11/30 0701 - 12/01 0700   I.V. (mL/kg) 1124 (16.2) 162 (2.3)   NG/GT     IV Piggyback 950    Total Intake(mL/kg) 2074 (29.9) 162 (2.3)   Urine (mL/kg/hr) 420 (0.3)    Total Output 420     Net +1654 +162          PHYSICAL EXAMINATION: General:  Elderly,chronically ill appearing  Neuro:  Sedate  HEENT:  NCAT,  Cardiovascular:  Sinus tachycardia,  no s3 nor s4, no murmur/rub/gallop Lungs:  coarse sounds, no wheeze, weak cough Abdomen:  (+)BS, soft, non distended Musculoskeletal:  Intact, appropriated strength, no edema, no lesion Skin:  Dry and warm to touch, no ulcer, no lesion  LABS:  CBC  Recent Labs Lab 03/28/13 1200 03/29/13 0420 03/30/13 0615  WBC 29.7* 29.3* 24.4*  HGB 12.5* 11.4* 10.6*  HCT 40.7 36.0* 34.4*  PLT 533* 426* 343   Coag's No results found for this basename: APTT, INR,  in the last 168 hours BMET  Recent Labs Lab 03/28/13 1045 03/29/13 0420 03/30/13 0615  NA 139 137 140  K 4.2 4.0 3.9  CL 104 105 108  CO2 25 23 22   BUN 19 26* 31*  CREATININE 1.01 1.47* 1.25  GLUCOSE 165* 138* 129*   Electrolytes  Recent Labs Lab 03/28/13 1045 03/29/13 0420 03/30/13 0615  CALCIUM 9.0 9.2 9.4  MG 1.6  --   --    Sepsis Markers  Recent Labs Lab 04/05/2013 1146 04/05/13 1834 03/28/13 1045  LATICACIDVEN 0.97  --  1.5  PROCALCITON  --  <0.10  --    ABG  Recent Labs Lab 03/05/2013 1127 03/28/13 1039 03/30/13 0442  PHART 7.418 7.310* 7.373  PCO2ART 39.0 47.3* 38.5  PO2ART 161.0* 127.0* 95.6   Liver Enzymes  Recent Labs Lab 03/28/13 1045 03/29/13 0420 03/30/13 0615  AST 9 8 18   ALT 5 <5 9  ALKPHOS 109 92 97  BILITOT 0.7 0.3 0.2*  ALBUMIN 2.7* 2.3* 2.3*   Cardiac Enzymes  Recent Labs Lab 03/19/2013 1135  PROBNP 411.0   Glucose  Recent Labs Lab 03/30/13 0356 03/30/13 0802 03/30/13 1236 03/30/13 1555 03/30/13 2043 03/30/13 2341  GLUCAP 123* 114* 114* 93 102* 92     Imaging Dg Chest Port 1 View  03/30/2013   CLINICAL DATA:  Intubated.  Lung carcinoma.  EXAM: PORTABLE CHEST - 1 VIEW  COMPARISON:  03/28/2013  FINDINGS: Endotracheal tube 4.7 cm above carina. Left IJ central line to the proximal SVC. A nasogastric tube to the stomach. Improved right lower lobe aeration. Persistent left hilar mass. Left retrocardiac consolidation/ atelectasis stable.  IMPRESSION: 1. Improved right lower lobe aeration since prior study   Electronically Signed   By: Oley Balm M.D.   On: 03/30/2013 07:21     CXR: 11/29: Improved RLL aeration   ASSESSMENT / PLAN:  PULMONARY A: Acute Respiratory Failure  --> CAP/PNA, Mediastinal Cancer Mass  --> Mucous plug and obstruction CAP Squamous cell cancer of lung-no cancer obstruction on bronchoscopy  P:   -comfort main goal now   CARDIOVASCULAR A:  Hypertension by History  --> Currently hypotensive  --> Likely due to sedation but at risk for sepsis and shock AF-RVR P:     RENAL A:   Hypotension AKI - current scr 1.47 and baseline at 1.1  --> Likely infectious driven, dehydration, and recent contrast used, atn  P:   -  IVF at 50   GASTROINTESTINAL A:   GERD/Reflux base on Medication History At risk for C. Diff  P:   - Monitor diarrhea   HEMATOLOGIC A:   Leukocytosis Thrombocytosis  --> Likely reactive to PNA above P:  - Daily CBC   INFECTIOUS A:   Pneumonia,  bronch NOT  post obstructive cap  P:   -Off  Vancomycin and Primaxin   ENDOCRINE A:   T2DM  - Adequate control with A1c of 6 on admission   P:   - Insulin sliding scale - CBG  NEUROLOGIC A:   Depression - reactive to new cancer diagnosis Encephalopathy - secondary to sedation from being intubated History of CVA x2 H/o narcotic use for chronic neck pain  P:     Summary -I have had extensive discussion with daughter [name] & son [name].  Full comfort care on morphine gtt, can transfer to  floor      03/31/2013, 11:20 AM Cyril Mourning MD. FCCP. [facility] Pager [phone] If no response call [phone]

## 2013-03-31 NOTE — Progress Notes (Signed)
eLink Physician-Brief Progress Note Patient Name: [name] DOB: 10-Feb-1933 MRN: [id number]  Date of Service  03/31/2013   HPI/Events of Note   Family now wants full comfort care. Orders addressed.  eICU Interventions  See orders   Intervention Category Major Interventions: End of life / care limitation discussion  Shan Levans 03/31/2013, 4:28 AM

## 2013-04-01 LAB — CULTURE, RESPIRATORY W GRAM STAIN

## 2013-04-01 DEATH — deceased

## 2013-04-02 LAB — CULTURE, BLOOD (ROUTINE X 2): Culture: NO GROWTH

## 2013-04-04 ENCOUNTER — Ambulatory Visit: Payer: Medicare Other | Admitting: Radiation Oncology

## 2013-04-04 NOTE — Discharge Summary (Signed)
PULMONARY  / CRITICAL CARE MEDICINE  Name: [name] MRN: [id number] DOB: 1932/08/07    ADMISSION DATE:  19-Apr-2013 CONSULTATION DATE:  03/28/2013 at [facility] MD :  Dr. Joseph Art PRIMARY SERVICE: PCCM  CHIEF COMPLAINT:  Acute Respiratory Failure, Squamous Cell  Cancer of Lung, Airway Support  BRIEF PATIENT DESCRIPTION: 79 year old caucasian male  With CVA 2000/2002, T2DM, HTN, HLD, Glaucoma, BPH s/p TURP, Recently Diagnosed Squamous Cell Lung Caner TTF-1 with Negative Cytokeratin on Biposy 03/04/13 admitted for PNA 11/26.  His respiratory status worsened overnight requiring 100%FiO2 on BiPAP for adequate saturation.  PCCM was consulted to resume his care in light of acute/rapidly respiratory failure.  His respiratory failure was likely secondary to mucous plugging causing by cancer mass causing extrinsic compression.  He was intubated, vented, and underwent bronchoscopy for washing.  SIGNIFICANT EVENTS / STUDIES:  11/26: CTA with large left anterior mediastinal mass without interval changes from previous  Consolidation of LLL  New obstruction of RLL  Negative for PE 11/27:  Underwent endotracheal intubation  Underwent bronchoscopy with washing and eval left lower lobe  LINES / TUBES: ET Tube 11/27 >>>11/29 (self ext) Left IJ CVL 11/27 >>> Peripheral IVs 11/26 >>> Foley 11/26 >>>  CULTURES: Sputum culture 11/27 >>>ng Blood culture 11/26 >>>ng  ANTIBIOTICS: Vancomycin 11/26 >>> Primaxin 11/26 >>> Cefepime 11/26 >>> 11/27  COURSE :   PULMONARY A: Acute Respiratory Failure  --> CAP/PNA, Mediastinal Cancer Mass  --> Mucous plug and obstruction CAP Squamous cell cancer of lung-no cancer obstruction on bronchoscopy    CARDIOVASCULAR A:  Hypertension by History  --> Currently hypotensive  --> Likely due to sedation but at risk for sepsis and shock AF-RVR     RENAL A:   Hypotension AKI - current scr 1.47 and baseline at 1.1  --> Likely infectious  driven, dehydration, and recent contrast used, atn    INFECTIOUS A:   Pneumonia,  bronch NOT  post obstructive cap  P:   - Vancomycin and Primaxin   ENDOCRINE A:   T2DM  - Adequate control with A1c of 6 on admission   P:   - Insulin sliding scale - CBG  NEUROLOGIC A:   Depression - reactive to new cancer diagnosis Encephalopathy - secondary to sedation from being intubated History of CVA x2 H/o narcotic use for chronic neck pain      -I  had extensive discussion with daughter [name] & son [name]. After his self extubation. He was transitioned to full comfort care on morphine gtt & passed away soon after  Cause of death : Acute respiratory failure due to Pneumonia, NSCLC      04/04/2013, 3:44 PM Cyril Mourning MD. FCCP. [facility] Pager [phone] If no response call [phone]

## 2013-04-05 ENCOUNTER — Ambulatory Visit: Payer: Medicare Other

## 2013-04-08 ENCOUNTER — Encounter: Payer: Self-pay | Admitting: Radiation Oncology

## 2013-04-08 ENCOUNTER — Ambulatory Visit: Payer: Medicare Other

## 2013-04-09 ENCOUNTER — Ambulatory Visit: Payer: Medicare Other

## 2013-04-10 ENCOUNTER — Ambulatory Visit: Payer: Medicare Other

## 2013-04-11 ENCOUNTER — Ambulatory Visit: Payer: Medicare Other

## 2013-04-12 ENCOUNTER — Ambulatory Visit: Payer: Medicare Other

## 2013-04-15 ENCOUNTER — Ambulatory Visit: Payer: Medicare Other

## 2013-04-16 ENCOUNTER — Ambulatory Visit: Payer: Medicare Other

## 2013-04-17 ENCOUNTER — Ambulatory Visit: Payer: Medicare Other

## 2013-04-18 ENCOUNTER — Ambulatory Visit: Payer: Medicare Other

## 2013-04-19 ENCOUNTER — Ambulatory Visit: Payer: Medicare Other

## 2013-04-22 ENCOUNTER — Ambulatory Visit: Payer: Medicare Other

## 2013-04-22 NOTE — Addendum Note (Signed)
Encounter addended by: Jonna Coup, MD on: 04/22/2013  9:21 PM<BR>     Documentation filed: Notes Section, Follow-up Section, LOS Section, Visit Diagnoses

## 2013-04-22 NOTE — Progress Notes (Signed)
The patient was seen in multidisciplinary lung clinic and he elected to proceed with a palliative course of radiation treatment. The patient's status worsened unfortunately and he was admitted to the hospital with worsening respiratory status. He experienced acute respiratory distress and was intubated. The patient unfortunately died during this hospital stay. He did not receive any radiation treatment therefore in our department.

## 2013-04-22 NOTE — Progress Notes (Signed)
  Radiation Oncology         ([phone]) [phone] ________________________________  Name: [name] MRN: [id number]  Date: 03/22/2013  DOB: 1932/10/11  SIMULATION AND TREATMENT PLANNING NOTE  DIAGNOSIS:  Non-small cell lung cancer  NARRATIVE:  The patient was brought to the CT Simulation planning suite.  Identity was confirmed.  All relevant records and images related to the planned course of therapy were reviewed.   Written consent to proceed with treatment was confirmed which was freely given after reviewing the details related to the planned course of therapy had been reviewed with the patient.  Then, the patient was set-up in a stable reproducible  supine position for radiation therapy.  CT images were obtained.  Surface markings were placed.    The CT images were loaded into the planning software.  Then the target and avoidance structures were contoured.  Treatment planning then occurred.  The radiation prescription was entered and confirmed.  A total of 3 complex treatment devices were fabricated which relate to the designed radiation treatment fields. Each of these customized fields/ complex treatment devices will be used on a daily basis during the radiation course. I have requested : 3D Simulation  I have requested a DVH of the following structures: Gross tumor volume, lungs, spinal cord, esophagus.   PLAN:  The patient will receive 60 Gy at 2.5 gray per fraction in a hypo-fractionated manner..  ________________________________   Radene Gunning, MD, PhD

## 2013-04-23 ENCOUNTER — Ambulatory Visit: Payer: Medicare Other

## 2013-04-24 ENCOUNTER — Ambulatory Visit: Payer: Medicare Other

## 2013-04-26 ENCOUNTER — Ambulatory Visit: Payer: Medicare Other

## 2013-04-29 ENCOUNTER — Ambulatory Visit: Payer: Medicare Other

## 2013-04-30 ENCOUNTER — Ambulatory Visit: Payer: Medicare Other

## 2013-05-01 ENCOUNTER — Ambulatory Visit: Payer: Medicare Other

## 2013-05-02 NOTE — Progress Notes (Cosign Needed)
Morphine 105 ml wasted in sink

## 2013-05-02 NOTE — Progress Notes (Signed)
RN called to room. Pt found Pulseless and without RR. Verified by 2 RNs. Family at Bedside. MD Notified. [facility] notified as well.

## 2013-05-02 DEATH — deceased

## 2013-05-03 ENCOUNTER — Ambulatory Visit: Payer: Medicare Other

## 2013-05-06 ENCOUNTER — Ambulatory Visit: Payer: Medicare Other

## 2013-05-07 ENCOUNTER — Ambulatory Visit: Payer: Medicare Other

## 2013-05-08 ENCOUNTER — Ambulatory Visit: Payer: Medicare Other

## 2013-05-09 ENCOUNTER — Ambulatory Visit: Payer: Medicare Other

## 2013-05-10 ENCOUNTER — Ambulatory Visit: Payer: Medicare Other

## 2013-05-13 ENCOUNTER — Ambulatory Visit: Payer: Medicare Other

## 2013-05-14 ENCOUNTER — Ambulatory Visit: Payer: Medicare Other

## 2013-11-28 IMAGING — CT CT ANGIO CHEST
1 of 2 series · 19 of 32 positions shown · IV contrast (OMNIPAQUE 350)
Comparison: 02/08/2013

CLINICAL DATA: Shortness of breath, cough, lung cancer

EXAM:
CT ANGIOGRAPHY CHEST WITH CONTRAST
TECHNIQUE: Multidetector CT imaging of the chest was performed using the
standard protocol during bolus administration of intravenous
contrast. Multiplanar CT image reconstructions including MIPs were
obtained to evaluate the vascular anatomy.
CONTRAST:  100mL OMNIPAQUE IOHEXOL 350 MG/ML SOLN

[Series 10: thins for pacs · axial · 0.77mm/px · z∈[+1225,+1455]mm · 19 of 256 slices shown]
[im 13/256  lung]
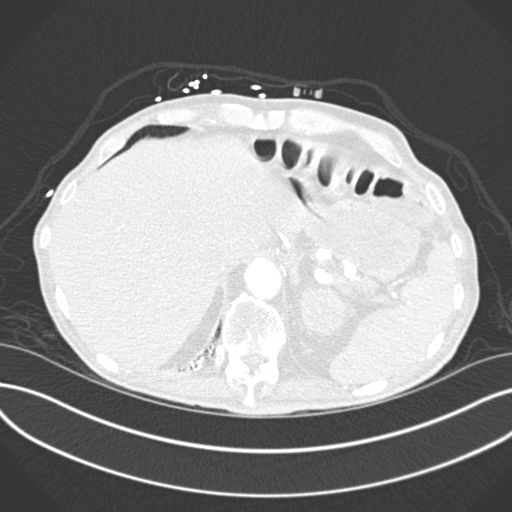
[im 26/256  mediastinal]
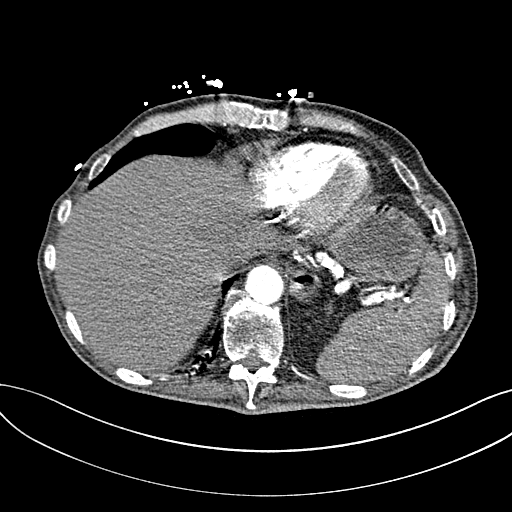
[im 39/256  lung]
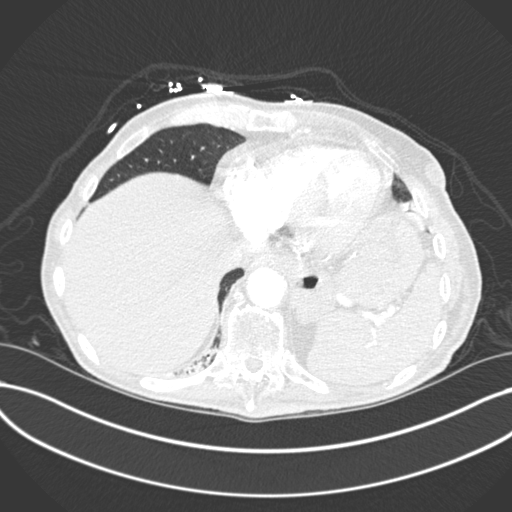
[im 64/256  mediastinal]
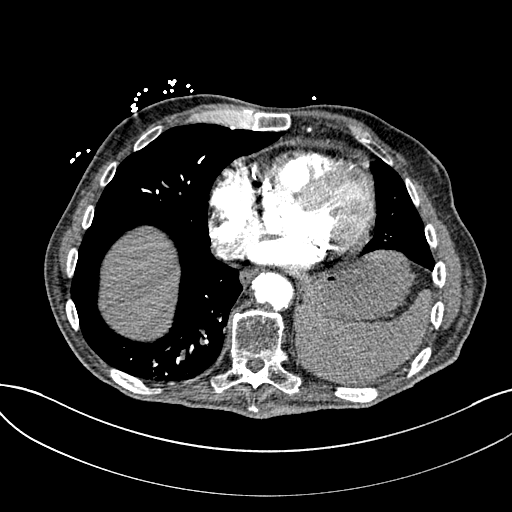
[im 77/256  lung]
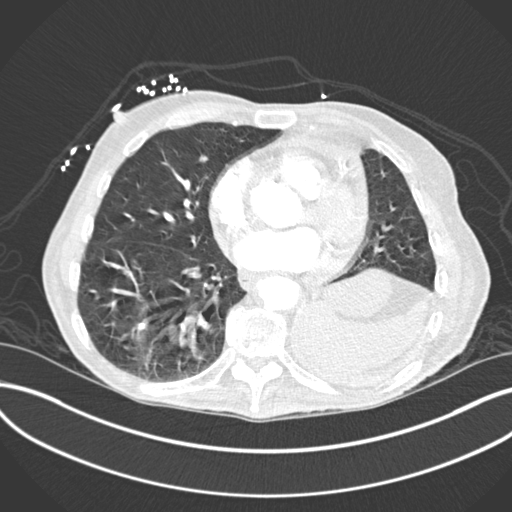
[im 86/256  mediastinal]
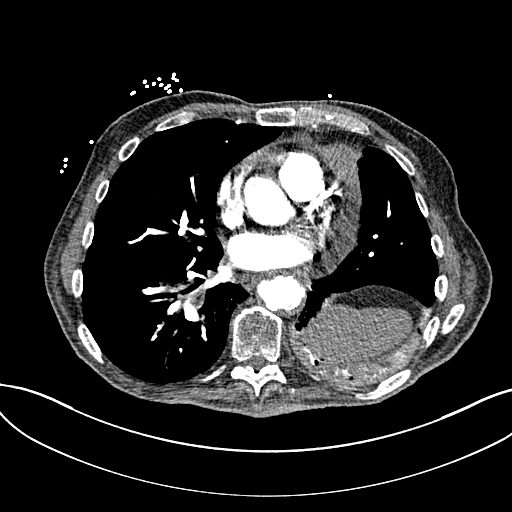
[im 90/256  lung]
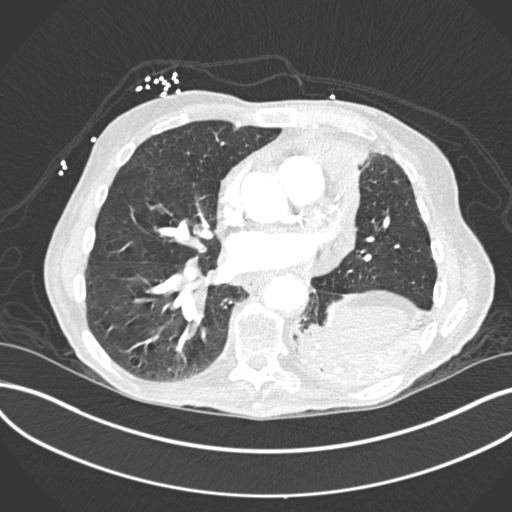
[im 103/256  mediastinal]
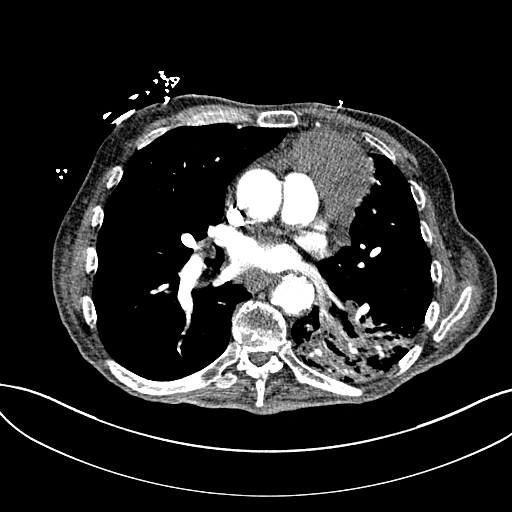
[im 115/256  lung]
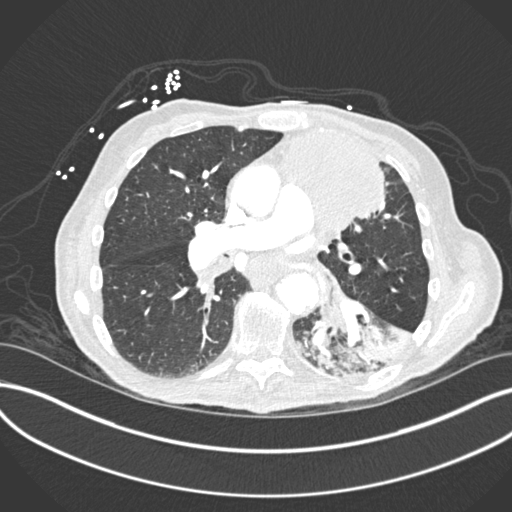
[im 128/256  mediastinal]
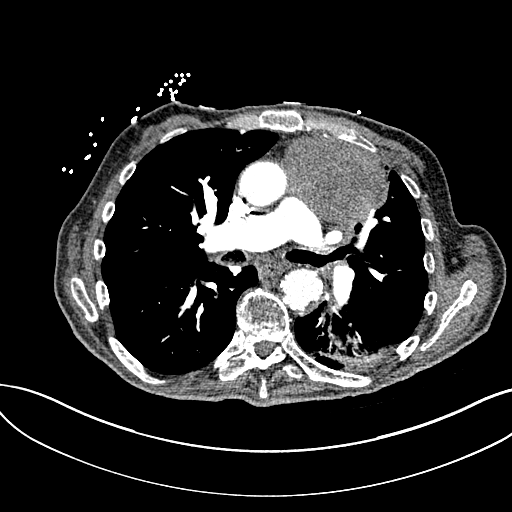
[im 141/256  lung]
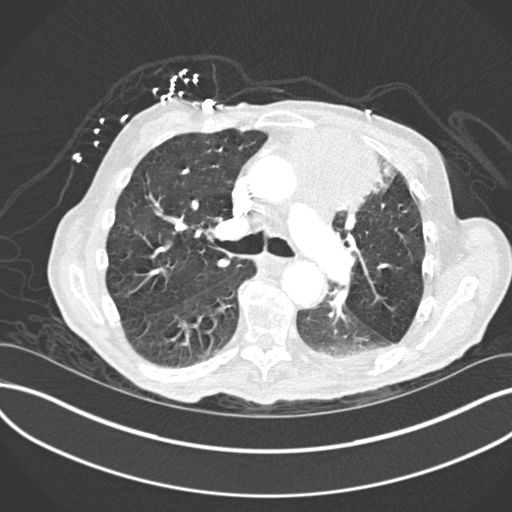
[im 154/256  mediastinal]
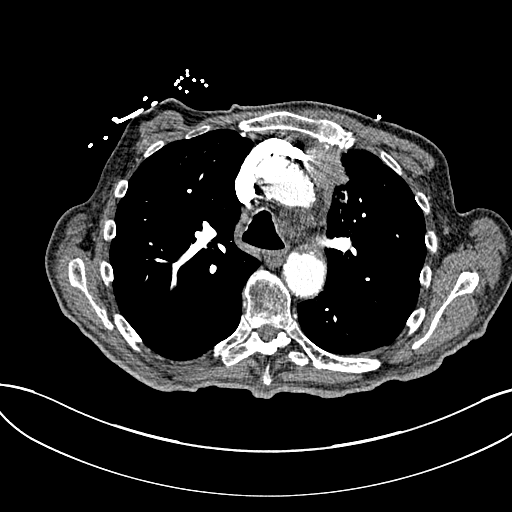
[im 166/256  lung]
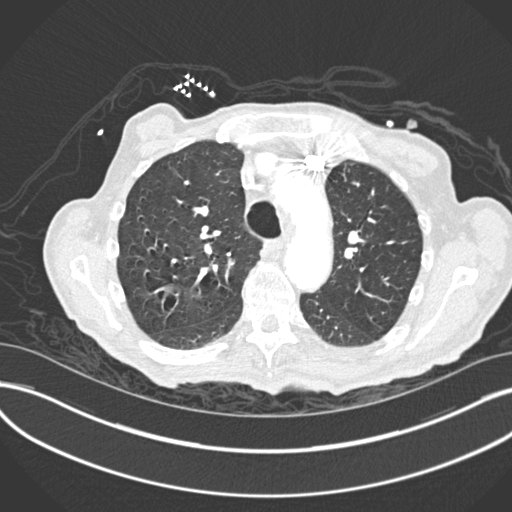
[im 171/256  mediastinal]
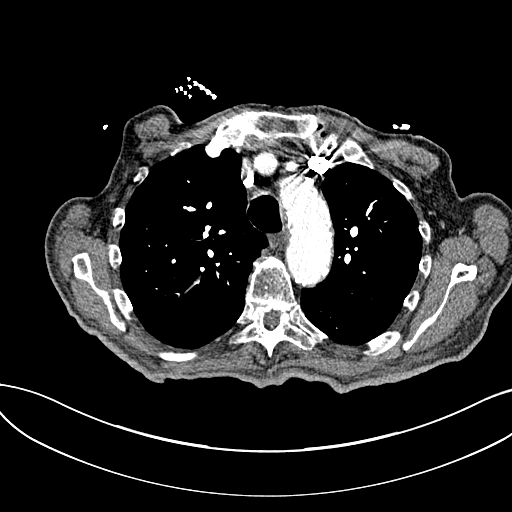
[im 179/256  lung]
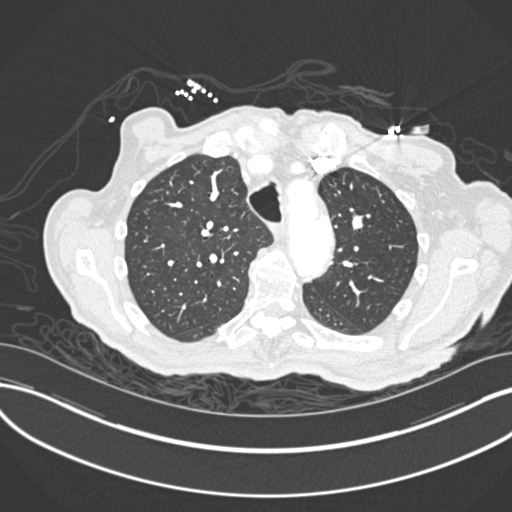
[im 192/256  mediastinal]
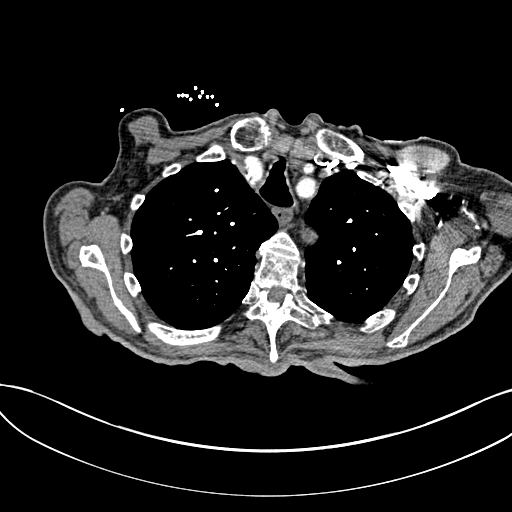
[im 217/256  lung]
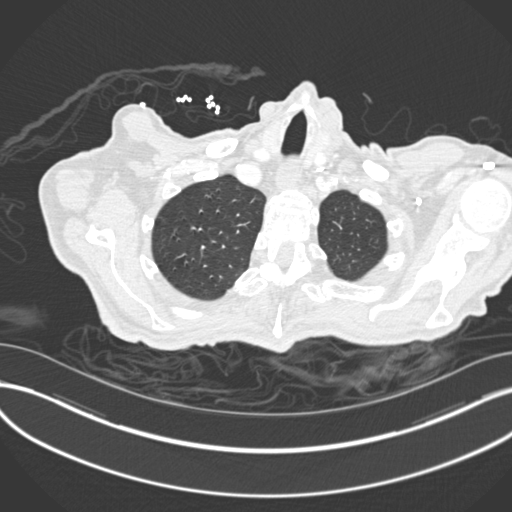
[im 230/256  mediastinal]
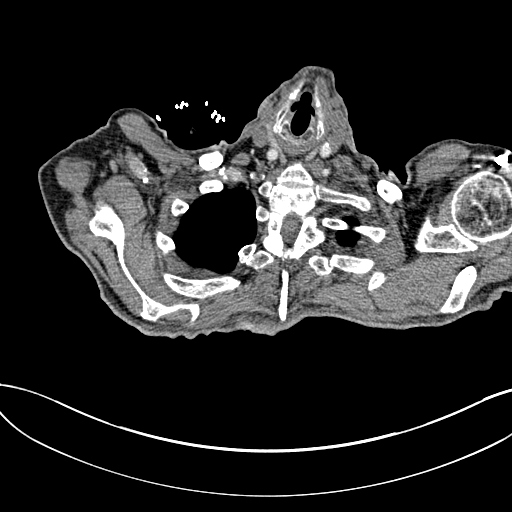
[im 243/256  lung]
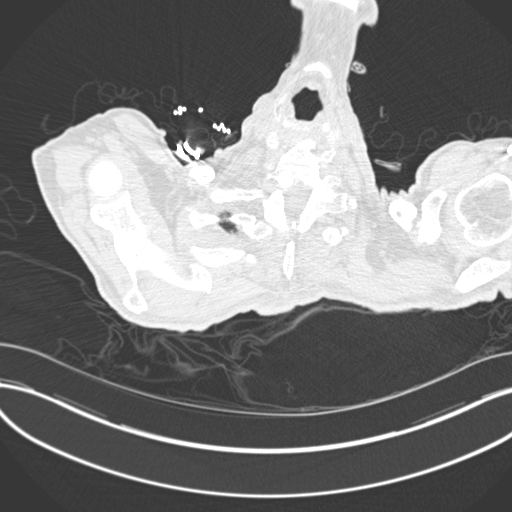

[19 of 32 positions shown; findings below may reference images not displayed]

FINDINGS: Again noted large left anterior mediastinal mass without change from
prior exam.

The study is of excellent technical quality. No pulmonary embolus is
noted. Trace pericardial effusion. Atherosclerotic calcifications of
coronary arteries and thoracic aorta. Splenic artery calcifications.
No adrenal gland mass is noted in visualized upper abdomen.

Sagittal images of the spine shows diffuse osteopenia and
degenerative changes thoracic spine.

No destructive rib lesions are noted. There is elevation of the left
hemidiaphragm. There is left lower lobe posterior consolidation with
air bronchogram without change from prior exam. Again noted
obstruction of left lower lobe airway. No precarinal or hilar
adenopathy.

There is new obstruction probable due to secretions of right lower
lobe airway as seen in image 45. There is new obstruction of right
upper lobe lateral segment airway as seen image 54.

No aortic aneurysm.

Review of the MIP images confirms the above findings.
IMPRESSION: 1. No pulmonary embolus is noted.
2. Again noted large left anterior mediastinal mass.
3. Again noted consolidation with air bronchogram in left lower lobe
posteriorly. Again noted obstruction of left lower lobe airway.
There is new obstruction of right lower lobe airway. Partial
obstruction probable due to secretions or mucus plug of lateral
segment right upper lobe airway.
4. Osteopenia and degenerative changes thoracic spine. No
destructive bony lesions are noted.

## 2013-11-29 IMAGING — CR DG CHEST 1V PORT
1 series · 2 of 2 positions shown · non-contrast
Comparison: 03/28/2013 [DATE] a.m..

CLINICAL DATA: Left internal jugular line placement.

EXAM:
PORTABLE CHEST - 1 VIEW

[Series 1: AP · U · 2 of 2 slices shown]
[im 1/2]
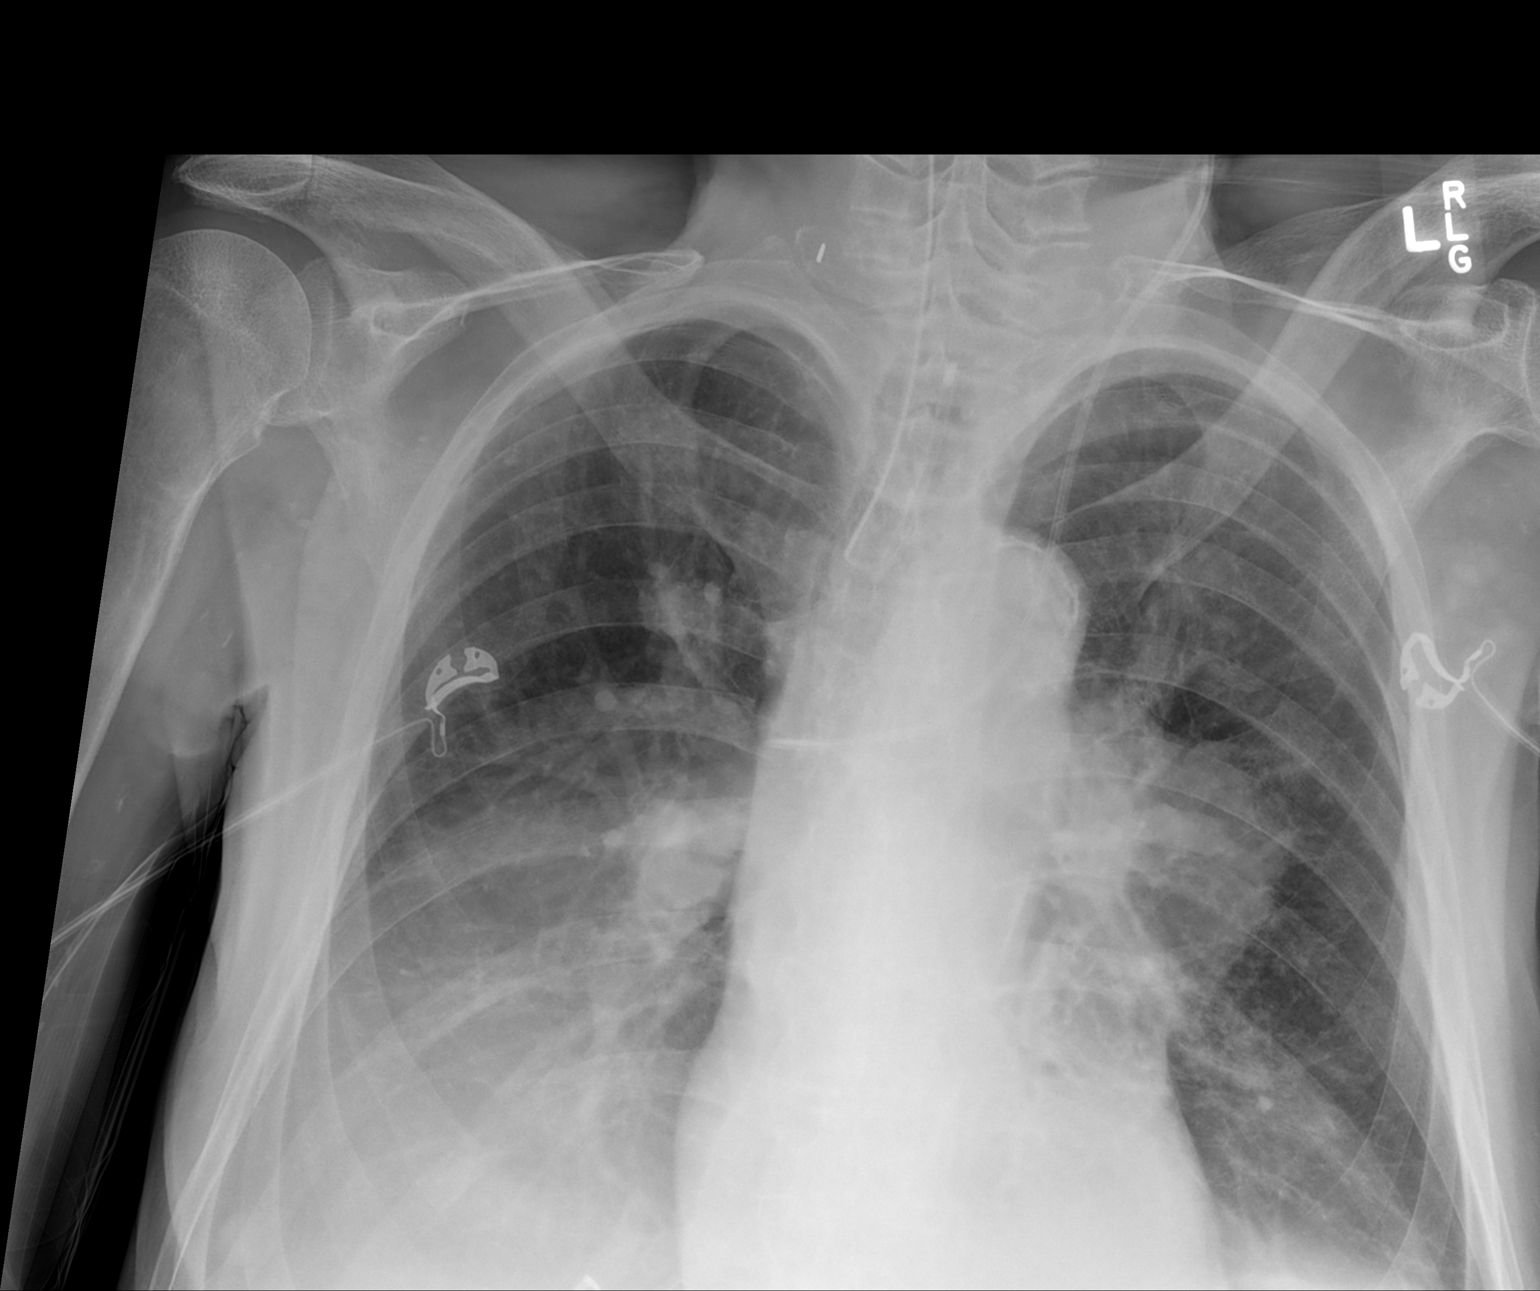
[im 2/2]
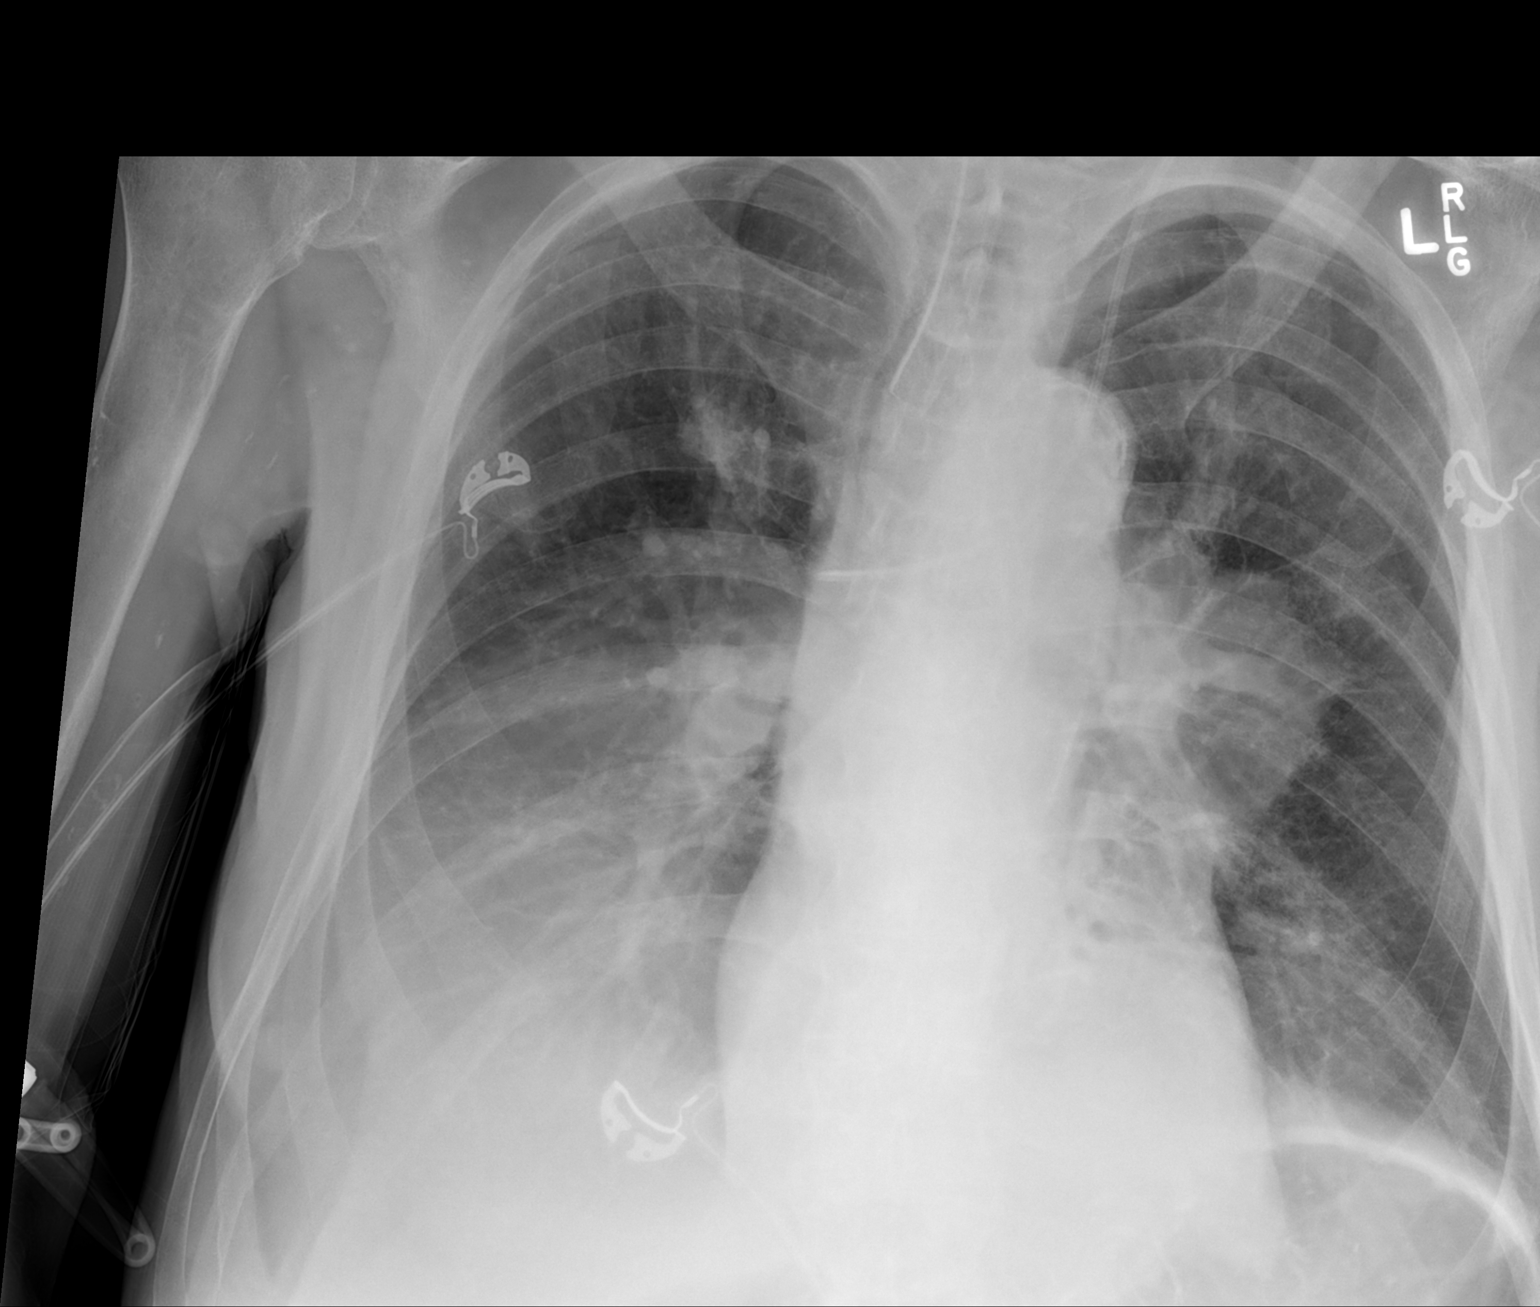

[2 of 2 positions shown; findings below may reference images not displayed]

FINDINGS: Left internal jugular catheter tip at the level of the proximal
superior vena cava directed laterally. This may partially tent the
lateral wall of the superior vena cava. No gross pneumothorax.

Endotracheal tube tip 5 cm above the carina.

Left anterior mediastinal mass projects over the hilum.

Consolidation lung bases greater on the right.

Pulmonary vascular congestion.

Calcified slightly tortuous aorta.

Gas-filled stomach.
IMPRESSION: Left internal jugular catheter tip at the level of the proximal
superior vena cava directed laterally. This may partially tent the
lateral wall of the superior vena cava. No gross pneumothorax.

Please see above.

This is a call report.

## 2013-11-29 IMAGING — CR DG CHEST 1V PORT
1 series · 1 of 1 positions shown · non-contrast
Comparison: 03/28/2013 chest x-ray, 03/27/2013 chest CT, 02/22/2013
PET-CT.

CLINICAL DATA: Post bronchoscopy.

EXAM:
PORTABLE CHEST - 1 VIEW

[AP]
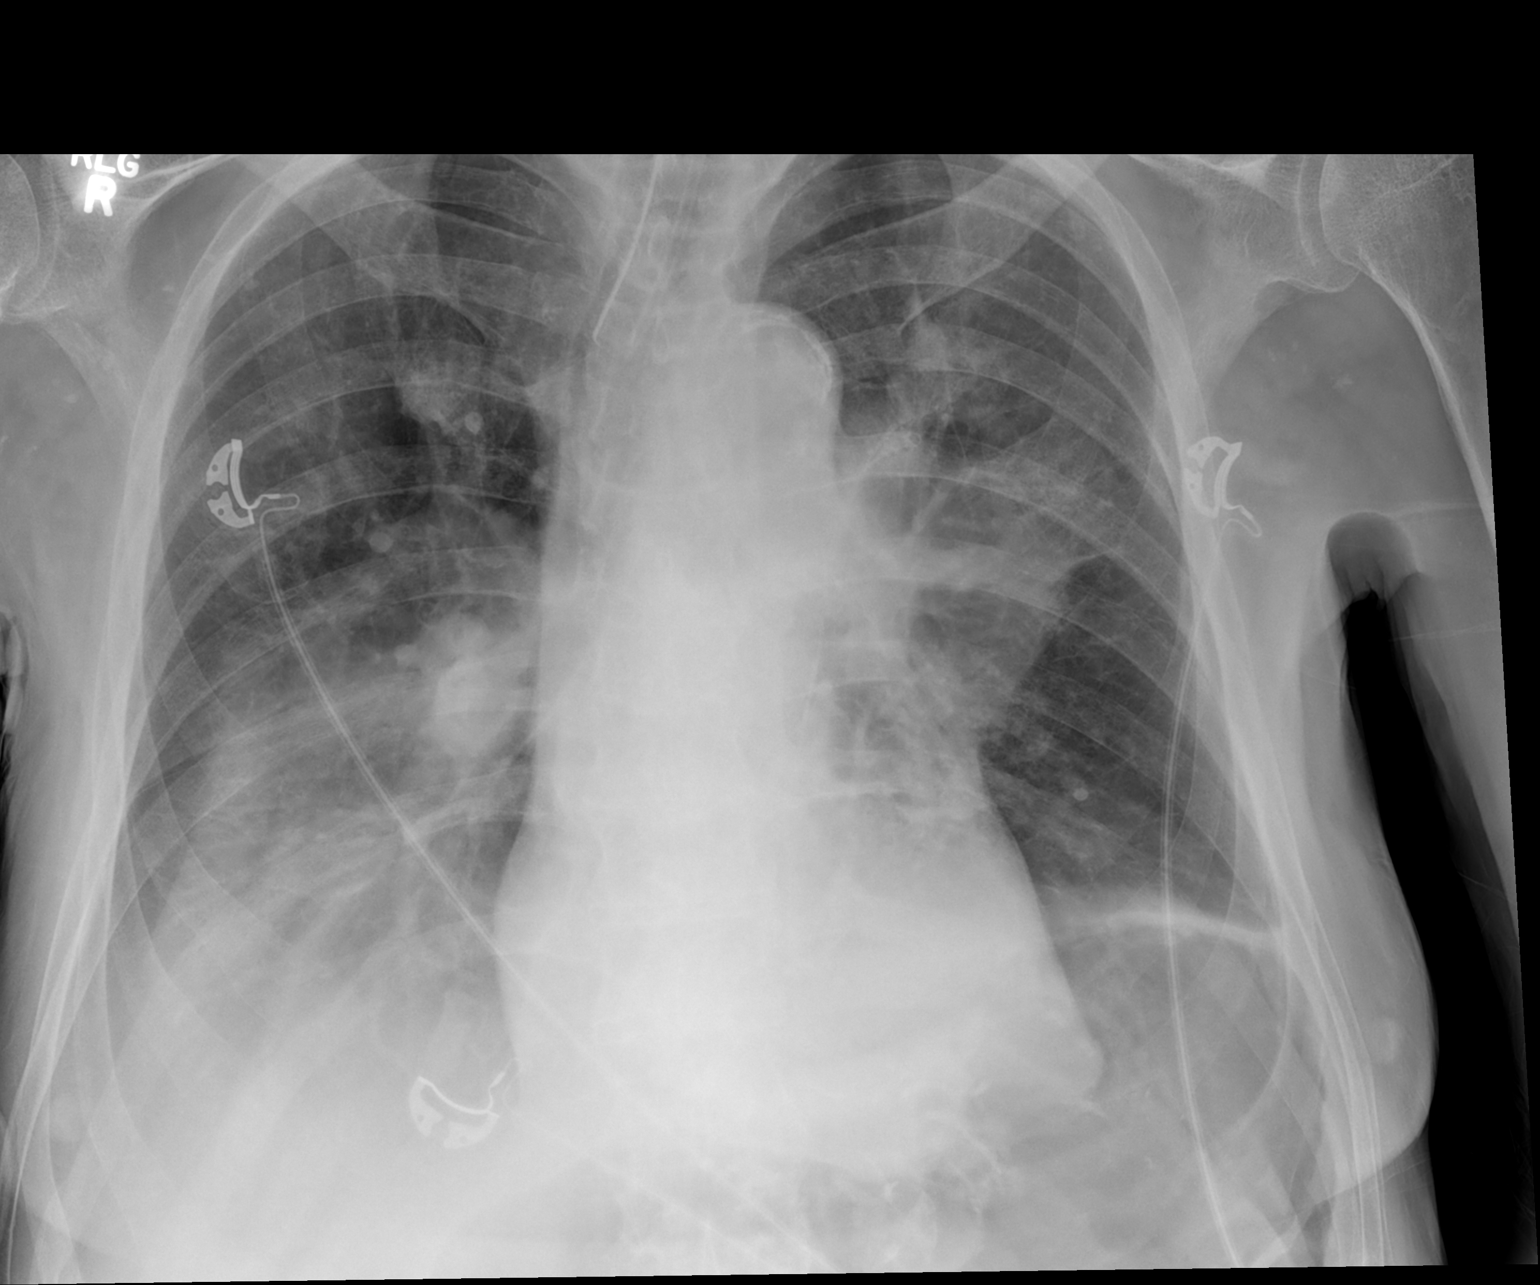

[1 of 1 positions shown; findings below may reference images not displayed]

FINDINGS: Endotracheal tube tip 4.8 cm above the carina.

Gas-filled stomach. This may be related to endotracheal tube
placement. Esophageal intubation not felt be of clinical concern.

Anterior mediastinal mass projects over the left hilum.

Consolidation lung bases greater on the right. Consolidation of the
right lung base has progressed since the prior examination. This may
represent result of pneumonia potentially aspiration pneumonia.
Atelectasis not excluded.

No gross pneumothorax.  Prominent skin fold on the right suspected.

Prominent right-sided nipple shadow.

Calcified aorta.
IMPRESSION: Endotracheal tube tip 4.8 cm above the carina.

Gas-filled stomach. This may be related to endotracheal tube
placement. Esophageal intubation not felt be of clinical concern.

Anterior mediastinal mass projects over the left hilum.

Consolidation lung bases greater on the right. Consolidation of the
right lung base has progressed since the prior examination. This may
represent result of pneumonia potentially aspiration pneumonia.
Atelectasis not excluded.

No gross pneumothorax.  Prominent skin fold on the right suspected.

These results were called by telephone at the time of interpretation
on 03/28/2013 at [DATE] to Dr. HARSHRAJ LIYAKAT , who verbally
acknowledged these results.
